# Patient Record
Sex: Male | Born: 1935 | Race: White | Hispanic: No | Marital: Single | Smoking: Never smoker
Health system: Southern US, Community
[De-identification: ages and names within clinical notes are randomized; demographics above are authoritative.]

## PROBLEM LIST (undated history)

## (undated) DIAGNOSIS — K222 Esophageal obstruction: Secondary | ICD-10-CM

## (undated) DIAGNOSIS — C61 Malignant neoplasm of prostate: Secondary | ICD-10-CM

## (undated) DIAGNOSIS — M5126 Other intervertebral disc displacement, lumbar region: Secondary | ICD-10-CM

## (undated) DIAGNOSIS — K635 Polyp of colon: Secondary | ICD-10-CM

## (undated) DIAGNOSIS — M419 Scoliosis, unspecified: Secondary | ICD-10-CM

## (undated) DIAGNOSIS — M1990 Unspecified osteoarthritis, unspecified site: Secondary | ICD-10-CM

## (undated) DIAGNOSIS — M4726 Other spondylosis with radiculopathy, lumbar region: Secondary | ICD-10-CM

## (undated) DIAGNOSIS — K219 Gastro-esophageal reflux disease without esophagitis: Secondary | ICD-10-CM

## (undated) DIAGNOSIS — M5416 Radiculopathy, lumbar region: Secondary | ICD-10-CM

## (undated) DIAGNOSIS — Z85820 Personal history of malignant melanoma of skin: Secondary | ICD-10-CM

## (undated) DIAGNOSIS — K5190 Ulcerative colitis, unspecified, without complications: Secondary | ICD-10-CM

## (undated) DIAGNOSIS — L570 Actinic keratosis: Secondary | ICD-10-CM

## (undated) DIAGNOSIS — M48061 Spinal stenosis, lumbar region without neurogenic claudication: Secondary | ICD-10-CM

## (undated) DIAGNOSIS — Z85828 Personal history of other malignant neoplasm of skin: Secondary | ICD-10-CM

## (undated) DIAGNOSIS — I10 Essential (primary) hypertension: Secondary | ICD-10-CM

## (undated) HISTORY — PX: COLONOSCOPY: SHX174

## (undated) HISTORY — PX: HERNIA REPAIR: SHX51

## (undated) HISTORY — DX: Actinic keratosis: L57.0

## (undated) HISTORY — PX: OTHER SURGICAL HISTORY: SHX169

## (undated) HISTORY — PX: TONSILLECTOMY: SUR1361

---

## 2004-02-05 ENCOUNTER — Ambulatory Visit: Payer: Self-pay | Admitting: Gastroenterology

## 2004-02-05 HISTORY — PX: ESOPHAGOGASTRODUODENOSCOPY: SHX1529

## 2004-03-20 HISTORY — PX: JOINT REPLACEMENT: SHX530

## 2004-04-07 ENCOUNTER — Ambulatory Visit: Payer: Self-pay | Admitting: Gastroenterology

## 2004-05-25 ENCOUNTER — Other Ambulatory Visit: Payer: Self-pay

## 2004-05-31 ENCOUNTER — Ambulatory Visit: Payer: Self-pay | Admitting: Orthopaedic Surgery

## 2004-09-16 ENCOUNTER — Ambulatory Visit: Payer: Self-pay | Admitting: Orthopaedic Surgery

## 2007-10-23 ENCOUNTER — Ambulatory Visit: Payer: Self-pay | Admitting: Unknown Physician Specialty

## 2008-09-07 DIAGNOSIS — C439 Malignant melanoma of skin, unspecified: Secondary | ICD-10-CM

## 2008-09-07 HISTORY — DX: Malignant melanoma of skin, unspecified: C43.9

## 2008-11-10 DIAGNOSIS — C4492 Squamous cell carcinoma of skin, unspecified: Secondary | ICD-10-CM

## 2008-11-10 HISTORY — DX: Squamous cell carcinoma of skin, unspecified: C44.92

## 2008-12-25 ENCOUNTER — Ambulatory Visit: Payer: Self-pay | Admitting: Unknown Physician Specialty

## 2009-12-18 HISTORY — PX: OTHER SURGICAL HISTORY: SHX169

## 2010-01-20 ENCOUNTER — Ambulatory Visit: Payer: Self-pay | Admitting: Unknown Physician Specialty

## 2010-01-21 LAB — PATHOLOGY REPORT

## 2011-05-10 ENCOUNTER — Other Ambulatory Visit: Payer: Self-pay | Admitting: Gastroenterology

## 2012-11-25 DIAGNOSIS — L818 Other specified disorders of pigmentation: Secondary | ICD-10-CM

## 2012-11-25 HISTORY — DX: Other specified disorders of pigmentation: L81.8

## 2014-06-08 DIAGNOSIS — M4726 Other spondylosis with radiculopathy, lumbar region: Secondary | ICD-10-CM | POA: Insufficient documentation

## 2014-06-08 DIAGNOSIS — M419 Scoliosis, unspecified: Secondary | ICD-10-CM

## 2014-06-08 DIAGNOSIS — M48061 Spinal stenosis, lumbar region without neurogenic claudication: Secondary | ICD-10-CM

## 2014-06-08 DIAGNOSIS — M4150 Other secondary scoliosis, site unspecified: Secondary | ICD-10-CM | POA: Insufficient documentation

## 2014-06-08 HISTORY — DX: Other spondylosis with radiculopathy, lumbar region: M47.26

## 2014-06-08 HISTORY — DX: Scoliosis, unspecified: M41.9

## 2014-06-08 HISTORY — DX: Spinal stenosis, lumbar region without neurogenic claudication: M48.061

## 2014-06-23 DIAGNOSIS — M5416 Radiculopathy, lumbar region: Secondary | ICD-10-CM

## 2014-06-23 HISTORY — DX: Radiculopathy, lumbar region: M54.16

## 2015-04-01 ENCOUNTER — Encounter: Payer: Self-pay | Admitting: *Deleted

## 2015-04-02 ENCOUNTER — Ambulatory Visit: Payer: Medicare Other | Admitting: Certified Registered Nurse Anesthetist

## 2015-04-02 ENCOUNTER — Encounter
Admission: RE | Disposition: A | Discharge status: Home / Self Care | Payer: Self-pay | Source: Ambulatory Visit | Attending: Unknown Physician Specialty

## 2015-04-02 ENCOUNTER — Encounter: Payer: Self-pay | Admitting: *Deleted

## 2015-04-02 ENCOUNTER — Ambulatory Visit
Admission: RE | Admit: 2015-04-02 | Discharge: 2015-04-02 | Disposition: A | Discharge status: Home / Self Care | Payer: Medicare Other | Source: Ambulatory Visit | Attending: Unknown Physician Specialty | Admitting: Unknown Physician Specialty

## 2015-04-02 DIAGNOSIS — Z85820 Personal history of malignant melanoma of skin: Secondary | ICD-10-CM | POA: Diagnosis not present

## 2015-04-02 DIAGNOSIS — M1990 Unspecified osteoarthritis, unspecified site: Secondary | ICD-10-CM | POA: Insufficient documentation

## 2015-04-02 DIAGNOSIS — D72822 Plasmacytosis: Secondary | ICD-10-CM | POA: Diagnosis not present

## 2015-04-02 DIAGNOSIS — K219 Gastro-esophageal reflux disease without esophagitis: Secondary | ICD-10-CM | POA: Diagnosis not present

## 2015-04-02 DIAGNOSIS — Z8546 Personal history of malignant neoplasm of prostate: Secondary | ICD-10-CM | POA: Diagnosis not present

## 2015-04-02 DIAGNOSIS — Z85828 Personal history of other malignant neoplasm of skin: Secondary | ICD-10-CM | POA: Insufficient documentation

## 2015-04-02 DIAGNOSIS — M5126 Other intervertebral disc displacement, lumbar region: Secondary | ICD-10-CM | POA: Insufficient documentation

## 2015-04-02 DIAGNOSIS — M4806 Spinal stenosis, lumbar region: Secondary | ICD-10-CM | POA: Insufficient documentation

## 2015-04-02 DIAGNOSIS — Z79899 Other long term (current) drug therapy: Secondary | ICD-10-CM | POA: Insufficient documentation

## 2015-04-02 DIAGNOSIS — K5190 Ulcerative colitis, unspecified, without complications: Secondary | ICD-10-CM | POA: Insufficient documentation

## 2015-04-02 DIAGNOSIS — Z96612 Presence of left artificial shoulder joint: Secondary | ICD-10-CM | POA: Insufficient documentation

## 2015-04-02 DIAGNOSIS — K5730 Diverticulosis of large intestine without perforation or abscess without bleeding: Secondary | ICD-10-CM | POA: Insufficient documentation

## 2015-04-02 DIAGNOSIS — K640 First degree hemorrhoids: Secondary | ICD-10-CM | POA: Insufficient documentation

## 2015-04-02 DIAGNOSIS — Z86010 Personal history of colonic polyps: Secondary | ICD-10-CM | POA: Diagnosis not present

## 2015-04-02 DIAGNOSIS — M419 Scoliosis, unspecified: Secondary | ICD-10-CM | POA: Diagnosis not present

## 2015-04-02 HISTORY — DX: Other spondylosis with radiculopathy, lumbar region: M47.26

## 2015-04-02 HISTORY — DX: Personal history of other malignant neoplasm of skin: Z85.828

## 2015-04-02 HISTORY — DX: Polyp of colon: K63.5

## 2015-04-02 HISTORY — DX: Ulcerative colitis, unspecified, without complications: K51.90

## 2015-04-02 HISTORY — DX: Gastro-esophageal reflux disease without esophagitis: K21.9

## 2015-04-02 HISTORY — DX: Spinal stenosis, lumbar region without neurogenic claudication: M48.061

## 2015-04-02 HISTORY — DX: Other intervertebral disc displacement, lumbar region: M51.26

## 2015-04-02 HISTORY — DX: Esophageal obstruction: K22.2

## 2015-04-02 HISTORY — DX: Malignant neoplasm of prostate: C61

## 2015-04-02 HISTORY — DX: Radiculopathy, lumbar region: M54.16

## 2015-04-02 HISTORY — DX: Personal history of malignant melanoma of skin: Z85.820

## 2015-04-02 HISTORY — DX: Scoliosis, unspecified: M41.9

## 2015-04-02 HISTORY — DX: Unspecified osteoarthritis, unspecified site: M19.90

## 2015-04-02 HISTORY — PX: COLONOSCOPY WITH PROPOFOL: SHX5780

## 2015-04-02 SURGERY — COLONOSCOPY WITH PROPOFOL
Anesthesia: General

## 2015-04-02 MED ORDER — PROPOFOL 10 MG/ML IV BOLUS
INTRAVENOUS | Status: DC | PRN
  Administered 2015-04-02: 50 mg via INTRAVENOUS

## 2015-04-02 MED ORDER — GLYCOPYRROLATE 0.2 MG/ML IJ SOLN
INTRAMUSCULAR | Status: DC | PRN
  Administered 2015-04-02: 0.2 mg via INTRAVENOUS

## 2015-04-02 MED ORDER — SODIUM CHLORIDE 0.9 % IV SOLN
INTRAVENOUS | Status: DC
  Administered 2015-04-02: 09:00:00 via INTRAVENOUS

## 2015-04-02 MED ORDER — SODIUM CHLORIDE 0.9 % IV SOLN: INTRAVENOUS | Status: DC

## 2015-04-02 MED ORDER — PROPOFOL 500 MG/50ML IV EMUL
INTRAVENOUS | Status: DC | PRN
  Administered 2015-04-02: 140 ug/kg/min via INTRAVENOUS

## 2015-04-02 MED ORDER — LIDOCAINE HCL (CARDIAC) 20 MG/ML IV SOLN
INTRAVENOUS | Status: DC | PRN
  Administered 2015-04-02: 40 mg via INTRAVENOUS

## 2015-04-02 NOTE — Anesthesia Preprocedure Evaluation (Signed)
Anesthesia Evaluation  Patient identified by MRN, date of birth, ID band Patient awake    Reviewed: Allergy & Precautions, H&P , NPO status , Patient's Chart, lab work & pertinent test results, reviewed documented beta blocker date and time   Airway Mallampati: II  TM Distance: >3 FB Neck ROM: full    Dental no notable dental hx.    Pulmonary neg pulmonary ROS,    Pulmonary exam normal breath sounds clear to auscultation       Cardiovascular Exercise Tolerance: Good negative cardio ROS   Rhythm:regular Rate:Normal     Neuro/Psych negative neurological ROS  negative psych ROS   GI/Hepatic negative GI ROS, Neg liver ROS, PUD, GERD  ,  Endo/Other  negative endocrine ROS  Renal/GU negative Renal ROS  negative genitourinary   Musculoskeletal  (+) Arthritis ,   Abdominal   Peds  Hematology negative hematology ROS (+)   Anesthesia Other Findings   Reproductive/Obstetrics negative OB ROS                             Anesthesia Physical Anesthesia Plan  ASA: II  Anesthesia Plan: General   Post-op Pain Management:    Induction:   Airway Management Planned:   Additional Equipment:   Intra-op Plan:   Post-operative Plan:   Informed Consent: I have reviewed the patients History and Physical, chart, labs and discussed the procedure including the risks, benefits and alternatives for the proposed anesthesia with the patient or authorized representative who has indicated his/her understanding and acceptance.   Dental Advisory Given  Plan Discussed with: CRNA  Anesthesia Plan Comments:         Anesthesia Quick Evaluation

## 2015-04-02 NOTE — Anesthesia Procedure Notes (Signed)
Performed by: Bobbye Reinitz Pre-anesthesia Checklist: Patient identified, Emergency Drugs available, Suction available, Patient being monitored and Timeout performed Patient Re-evaluated:Patient Re-evaluated prior to inductionOxygen Delivery Method: Nasal cannula Intubation Type: IV induction       

## 2015-04-02 NOTE — H&P (Signed)
Primary Care Physician:  Maryland Pink, MD Primary Gastroenterologist:  Dr. Vira Agar  Pre-Procedure History & Physical: HPI:  CARLESTER DAYS is a 82 y.o. male is here for an colonoscopy.   Past Medical History  Diagnosis Date  . Lumbar radiculitis 06/23/14  . Scoliosis 06/08/14    Degenerative scoliosis in adult patient; chronic  . Spinal stenosis of lumbar region at multiple levels 06/08/14    Chronic  . Osteoarthritis of spine with radiculopathy, lumbar region 06/08/14    chronic  . Arthritis   . History of melanoma     chest and leg  . History of basal cell cancer     left temple  . Lumbar disc herniation   . Colon polyps   . GERD (gastroesophageal reflux disease)   . Esophageal stricture   . Ulcerative colitis ([place])     left sided  . Prostate cancer ([place])     Positive for SEED implantation    Past Surgical History  Procedure Laterality Date  . Seed implant for treatment of prostate cancer      Performed in [place]  . Hernia repair    . Joint replacement Left 2006    Shoulder replacement  . Tonsillectomy    . Pnuemovax  12/2009  . Esophagogastroduodenoscopy  02/05/2004  . Colonoscopy  03/2004; 10/2007; 12/2008; 01/2010    PH of adenomatous polyps; FH colon polyps (sister)    Prior to Admission medications   Medication Sig Start Date End Date Taking? Authorizing Provider  celecoxib (CELEBREX) 200 MG capsule Take 200 mg by mouth 2 (two) times daily.   Yes Historical Provider, MD  diphenhydramine-acetaminophen (TYLENOL PM) 25-500 MG TABS tablet Take 2 tablets by mouth at bedtime as needed.   Yes Historical Provider, MD  docusate sodium (COLACE) 100 MG capsule Take 100 mg by mouth 2 (two) times daily.   Yes Historical Provider, MD  Mesalamine (ASACOL HD) 800 MG TBEC Take by mouth.   Yes Historical Provider, MD  Multiple Vitamins-Minerals (MULTIVITAMIN ADULT) TABS Take by mouth.   Yes Historical Provider, MD  Omega-3 Fatty Acids (FISH OIL CONCENTRATE) 1000 MG CAPS Take  1 capsule by mouth daily.   Yes Historical Provider, MD  omeprazole (PRILOSEC) 20 MG capsule Take 20 mg by mouth daily.   Yes Historical Provider, MD  sildenafil (REVATIO) 20 MG tablet Take 20-100 mg by mouth as needed.   Yes Historical Provider, MD  valsartan (DIOVAN) 320 MG tablet Take 320 mg by mouth daily.   Yes Historical Provider, MD  Zinc 50 MG TABS Take 50 mg by mouth daily.   Yes Historical Provider, MD    Allergies as of 03/02/2015  . (Not on File)    History reviewed. No pertinent family history.  Social History   Social History  . Marital Status: Single    Spouse Name: N/A  . Number of Children: N/A  . Years of Education: N/A   Occupational History  . Not on file.   Social History Main Topics  . Smoking status: Never Smoker   . Smokeless tobacco: Not on file  . Alcohol Use: 0.0 oz/week    0 Glasses of wine per week  . Drug Use: No  . Sexual Activity: Not on file   Other Topics Concern  . Not on file   Social History Narrative    Review of Systems: See HPI, otherwise negative ROS  Physical Exam: BP 154/116 mmHg  Pulse 81  Temp(Src) 97.8 F (36.6  C) (Tympanic)  Resp 12  Ht 5\' 9"  (1.753 m)  Wt 72.576 kg (160 lb)  BMI 23.62 kg/m2  SpO2 100% General:   Alert,  pleasant and cooperative in NAD Head:  Normocephalic and atraumatic. Neck:  Supple; no masses or thyromegaly. Lungs:  Clear throughout to auscultation.    Heart:  Regular rate and rhythm. Abdomen:  Soft, nontender and nondistended. Normal bowel sounds, without guarding, and without rebound.   Neurologic:  Alert and  oriented x4;  grossly normal neurologically.  Impression/Plan: [name] is here for an colonoscopy to be performed for [facility] colon polyps  Risks, benefits, limitations, and alternatives regarding  colonoscopy have been reviewed with the patient.  Questions have been answered.  All parties agreeable.   Gaylyn Cheers, MD  04/02/2015, 9:15 AM

## 2015-04-02 NOTE — Transfer of Care (Signed)
Immediate Anesthesia Transfer of Care Note  Patient: Grant Miranda  Procedure(s) Performed: Procedure(s): COLONOSCOPY WITH PROPOFOL (N/A)  Patient Location: PACU  Anesthesia Type:General  Level of Consciousness: sedated  Airway & Oxygen Therapy: Patient Spontanous Breathing and Patient connected to nasal cannula oxygen  Post-op Assessment: Report given to RN and Post -op Vital signs reviewed and stable  Post vital signs: Reviewed and stable  Last Vitals:  Filed Vitals:   04/02/15 0826  BP: 154/116  Pulse: 81  Temp: 36.6 C  Resp: 12    Complications: No apparent anesthesia complications

## 2015-04-02 NOTE — Op Note (Signed)
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 Kary Kos, MD (Referring MD) Medicines:         Propofol per Anesthesia Complications:     No immediate complications. Procedure:         Pre-Anesthesia Assessment:                    - After reviewing the risks and benefits, the patient was                     deemed in satisfactory condition to undergo the procedure.                    After obtaining informed consent, the colonoscope was                     passed under direct vision. Throughout the procedure, the                     patient's blood pressure, pulse, and oxygen saturations                     were monitored continuously. The Colonoscope was                     introduced through the anus and advanced to the the cecum,                     identified by appendiceal orifice and ileocecal valve. The                     colonoscopy was performed without difficulty. The patient                     tolerated the procedure well. The quality of the bowel                     preparation was excellent. Findings:      A localized area of granular mucosa was found in the mid sigmoid colon.       Biopsies were taken with a cold forceps for histology.      Multiple small and large-mouthed diverticula were found in the sigmoid       colon, in the descending colon, in the transverse colon and in the       ascending colon.      Internal hemorrhoids were found during endoscopy. The hemorrhoids were       medium-sized and Grade I (internal hemorrhoids that do not  prolapse). Impression:        - Granularity in the mid sigmoid colon. Biopsied.                    - Diverticulosis in the sigmoid colon, in the descending  colon, in the transverse colon and in the ascending colon.                    - Internal hemorrhoids. Recommendation:    - Await pathology results. Manya Silvas, MD 04/02/2015 9:46:55 AM This report has been signed electronically. Number of Addenda: 0 Note Initiated On: 04/02/2015 8:52 AM Scope Withdrawal Time: 0 hours 10 minutes 11 seconds  Total Procedure Duration: 0 hours 21 minutes 49 seconds       [facility]

## 2015-04-03 NOTE — Progress Notes (Signed)
Voicemail Reached.  No Message Left.

## 2015-04-04 NOTE — Anesthesia Postprocedure Evaluation (Signed)
Anesthesia Post Note  Patient: Grant Miranda  Procedure(s) Performed: Procedure(s) (LRB): COLONOSCOPY WITH PROPOFOL (N/A)  Patient location during evaluation: PACU Anesthesia Type: General Level of consciousness: awake and alert Pain management: pain level controlled Vital Signs Assessment: post-procedure vital signs reviewed and stable Respiratory status: spontaneous breathing, nonlabored ventilation, respiratory function stable and patient connected to nasal cannula oxygen Cardiovascular status: blood pressure returned to baseline and stable Postop Assessment: no signs of nausea or vomiting Anesthetic complications: no    Last Vitals:  Filed Vitals:   04/02/15 0826 04/02/15 0945  BP: 154/116   Pulse: 81   Temp: 36.6 C 36.1 C  Resp: 12     Last Pain: There were no vitals filed for this visit.               Molli Barrows

## 2015-04-05 LAB — SURGICAL PATHOLOGY

## 2015-04-07 ENCOUNTER — Encounter: Payer: Self-pay | Admitting: Unknown Physician Specialty

## 2018-01-16 DIAGNOSIS — Z85828 Personal history of other malignant neoplasm of skin: Secondary | ICD-10-CM

## 2018-01-16 HISTORY — DX: Personal history of other malignant neoplasm of skin: Z85.828

## 2018-08-22 DIAGNOSIS — Z860101 Personal history of adenomatous and serrated colon polyps: Secondary | ICD-10-CM | POA: Insufficient documentation

## 2018-08-22 DIAGNOSIS — Z86010 Personal history of colonic polyps: Secondary | ICD-10-CM | POA: Insufficient documentation

## 2018-09-21 DIAGNOSIS — D720 Genetic anomalies of leukocytes: Secondary | ICD-10-CM | POA: Insufficient documentation

## 2018-09-21 NOTE — Progress Notes (Signed)
[place]  [phone] Fax:(336) [phone]  ID: Grant Miranda OB: [birth year]  MR#: [id number]  TDD#:[id number]  Patient Care Team: Maryland Pink, MD as PCP - General (Family Medicine)  CHIEF COMPLAINT:  Increased immature granulocytes.  INTERVAL HISTORY: Patient is an 85 year old male who was noted to have a persistently elevated white blood cell count along with an increase in immature granulocytes.  He currently feels well and is asymptomatic.  He denies any recent fevers or illnesses.  He has a good appetite and denies weight loss.  He has no chest pain, shortness of breath, cough, or hemoptysis.  He denies any nausea, vomiting, constipation, or diarrhea.  He has no urinary complaints.  Patient feels at his baseline offers no specific complaints today.  REVIEW OF SYSTEMS:   Review of Systems  Constitutional: Negative.  Negative for fever, malaise/fatigue and weight loss.  Respiratory: Negative.  Negative for cough, hemoptysis and shortness of breath.   Cardiovascular: Negative.  Negative for chest pain and leg swelling.  Gastrointestinal: Negative.  Negative for abdominal pain.  Genitourinary: Negative.  Negative for dysuria.  Musculoskeletal: Negative.  Negative for back pain.  Skin: Negative.  Negative for rash.  Neurological: Negative.  Negative for dizziness, focal weakness, weakness and headaches.  Psychiatric/Behavioral: Negative.  The patient is not nervous/anxious.     As per HPI. Otherwise, a complete review of systems is negative.  PAST MEDICAL HISTORY: Past Medical History:  Diagnosis Date  . Arthritis   . Colon polyps   . Esophageal stricture   . GERD (gastroesophageal reflux disease)   . History of basal cell cancer    left temple  . History of melanoma    chest and leg  . Lumbar disc herniation   . Lumbar radiculitis 06/23/14  . Osteoarthritis of spine with radiculopathy, lumbar region 06/08/14   chronic  . Prostate cancer  ([place])    Positive for SEED implantation  . Scoliosis 06/08/14   Degenerative scoliosis in adult patient; chronic  . Spinal stenosis of lumbar region at multiple levels 06/08/14   Chronic  . Ulcerative colitis ([place])    left sided    PAST SURGICAL HISTORY: Past Surgical History:  Procedure Laterality Date  . COLONOSCOPY  03/2004; 10/2007; 12/2008; 01/2010   PH of adenomatous polyps; FH colon polyps (sister)  . COLONOSCOPY WITH PROPOFOL N/A 04/02/2015   Procedure: COLONOSCOPY WITH PROPOFOL;  Surgeon: Manya Silvas, MD;  Location: [facility] ENDOSCOPY;  Service: Endoscopy;  Laterality: N/A;  . ESOPHAGOGASTRODUODENOSCOPY  02/05/2004  . HERNIA REPAIR    . JOINT REPLACEMENT Left 2006   Shoulder replacement  . Pnuemovax  12/2009  . Seed implant for treatment of prostate cancer     Performed in [place]  . TONSILLECTOMY      FAMILY HISTORY: Family History  Problem Relation Age of Onset  . Breast cancer Sister     ADVANCED DIRECTIVES (Y/N):  N  HEALTH MAINTENANCE: Social History   Tobacco Use  . Smoking status: Never Smoker  . Smokeless tobacco: Never Used  Substance Use Topics  . Alcohol use: Yes    Alcohol/week: 0.0 standard drinks  . Drug use: No     Colonoscopy:  PAP:  Bone density:  Lipid panel:  Allergies  Allergen Reactions  . Hydrochlorothiazide Rash  . Norvasc [Amlodipine] Rash  . Penicillin V Potassium Rash  . Sulfa Antibiotics Rash    Current Outpatient Medications  Medication Sig Dispense Refill  . celecoxib (CELEBREX) 200  MG capsule Take 200 mg by mouth 2 (two) times daily.    . diphenhydramine-acetaminophen (TYLENOL PM) 25-500 MG TABS tablet Take 2 tablets by mouth at bedtime as needed.    . docusate sodium (COLACE) 100 MG capsule Take 100 mg by mouth 2 (two) times daily.    . Mesalamine (ASACOL HD) 800 MG TBEC Take by mouth.    . Multiple Vitamins-Minerals (MULTIVITAMIN ADULT) TABS Take by mouth.    . Omega-3 Fatty Acids (FISH OIL CONCENTRATE) 1000 MG  CAPS Take 1 capsule by mouth daily.    Marland Kitchen omeprazole (PRILOSEC) 20 MG capsule Take 20 mg by mouth daily.    . valsartan (DIOVAN) 320 MG tablet Take 320 mg by mouth daily.    . sildenafil (REVATIO) 20 MG tablet Take 20-100 mg by mouth as needed.     No current facility-administered medications for this visit.     OBJECTIVE: Vitals:   09/27/18 1023  BP: (!) 150/90  Pulse: 83  Resp: 18  Temp: (!) 96.4 F (35.8 C)     Body mass index is 24.29 kg/m.    ECOG FS:0 - Asymptomatic  General: Well-developed, well-nourished, no acute distress. Eyes: Pink conjunctiva, anicteric sclera. HEENT: Normocephalic, moist mucous membranes, clear oropharnyx. Lungs: Clear to auscultation bilaterally. Heart: Regular rate and rhythm. No rubs, murmurs, or gallops. Abdomen: Soft, nontender, nondistended. No organomegaly noted, normoactive bowel sounds. Musculoskeletal: No edema, cyanosis, or clubbing. Neuro: Alert, answering all questions appropriately. Cranial nerves grossly intact. Skin: No rashes or petechiae noted. Psych: Normal affect. Lymphatics: No cervical, calvicular, axillary or inguinal LAD.   LAB RESULTS:  No results found for: NA, K, CL, CO2, GLUCOSE, BUN, CREATININE, CALCIUM, PROT, ALBUMIN, AST, ALT, ALKPHOS, BILITOT, GFRNONAA, GFRAA  Lab Results  Component Value Date   WBC 7.1 09/27/2018   NEUTROABS 5.1 09/27/2018   HGB 14.9 09/27/2018   HCT 43.9 09/27/2018   MCV 97.8 09/27/2018   PLT 180 09/27/2018     STUDIES: No results found.  ASSESSMENT: Increased immature granulocytes.  PLAN:    1.  Increased immature granulocytes: Patient's white blood cell count is now within normal limits, although he continues to have a mild increase in immature granulocytes.  Unclear the clinical significance.  Have ordered peripheral blood flow cytometry for further evaluation.  Patient will have a virtual video visit in 3 weeks to discuss the results and any additional follow-up necessary.  I  spent a total of 45 minutes face-to-face with the patient of which greater than 50% of the visit was spent in counseling and coordination of care as detailed above.  Patient expressed understanding and was in agreement with this plan. He also understands that He can call clinic at any time with any questions, concerns, or complaints.    Lloyd Huger, MD   09/29/2018 9:06 AM

## 2018-09-27 ENCOUNTER — Other Ambulatory Visit: Payer: Self-pay

## 2018-09-27 ENCOUNTER — Encounter (INDEPENDENT_AMBULATORY_CARE_PROVIDER_SITE_OTHER): Payer: Self-pay

## 2018-09-27 ENCOUNTER — Encounter: Payer: Self-pay | Admitting: Oncology

## 2018-09-27 ENCOUNTER — Inpatient Hospital Stay: Payer: Medicare Other

## 2018-09-27 ENCOUNTER — Inpatient Hospital Stay: Payer: Medicare Other | Attending: Oncology | Admitting: Oncology

## 2018-09-27 DIAGNOSIS — D720 Genetic anomalies of leukocytes: Secondary | ICD-10-CM

## 2018-09-27 DIAGNOSIS — Z85828 Personal history of other malignant neoplasm of skin: Secondary | ICD-10-CM | POA: Insufficient documentation

## 2018-09-27 DIAGNOSIS — K5190 Ulcerative colitis, unspecified, without complications: Secondary | ICD-10-CM | POA: Insufficient documentation

## 2018-09-27 DIAGNOSIS — K219 Gastro-esophageal reflux disease without esophagitis: Secondary | ICD-10-CM | POA: Insufficient documentation

## 2018-09-27 DIAGNOSIS — M5126 Other intervertebral disc displacement, lumbar region: Secondary | ICD-10-CM | POA: Insufficient documentation

## 2018-09-27 DIAGNOSIS — C61 Malignant neoplasm of prostate: Secondary | ICD-10-CM | POA: Insufficient documentation

## 2018-09-27 DIAGNOSIS — Z85820 Personal history of malignant melanoma of skin: Secondary | ICD-10-CM | POA: Insufficient documentation

## 2018-09-27 DIAGNOSIS — K635 Polyp of colon: Secondary | ICD-10-CM | POA: Insufficient documentation

## 2018-09-27 DIAGNOSIS — D72828 Other elevated white blood cell count: Secondary | ICD-10-CM

## 2018-09-27 DIAGNOSIS — Z803 Family history of malignant neoplasm of breast: Secondary | ICD-10-CM

## 2018-09-27 LAB — CBC WITH DIFFERENTIAL/PLATELET
Abs Immature Granulocytes: 0.12 10*3/uL — ABNORMAL HIGH (ref 0.00–0.07)
Basophils Absolute: 0 10*3/uL (ref 0.0–0.1)
Basophils Relative: 0 %
Eosinophils Absolute: 0.2 10*3/uL (ref 0.0–0.5)
Eosinophils Relative: 3 %
HCT: 43.9 % (ref 39.0–52.0)
Hemoglobin: 14.9 g/dL (ref 13.0–17.0)
Immature Granulocytes: 2 %
Lymphocytes Relative: 16 %
Lymphs Abs: 1.1 10*3/uL (ref 0.7–4.0)
MCH: 33.2 pg (ref 26.0–34.0)
MCHC: 33.9 g/dL (ref 30.0–36.0)
MCV: 97.8 fL (ref 80.0–100.0)
Monocytes Absolute: 0.5 10*3/uL (ref 0.1–1.0)
Monocytes Relative: 7 %
Neutro Abs: 5.1 10*3/uL (ref 1.7–7.7)
Neutrophils Relative %: 72 %
Platelets: 180 10*3/uL (ref 150–400)
RBC: 4.49 MIL/uL (ref 4.22–5.81)
RDW: 12 % (ref 11.5–15.5)
WBC: 7.1 10*3/uL (ref 4.0–10.5)
nRBC: 0 % (ref 0.0–0.2)

## 2018-09-27 NOTE — Progress Notes (Signed)
New patient hematology visit.

## 2018-09-30 LAB — COMP PANEL: LEUKEMIA/LYMPHOMA: Immunophenotypic Profile: 0

## 2018-10-18 ENCOUNTER — Ambulatory Visit: Payer: Medicare Other | Admitting: Oncology

## 2018-10-20 NOTE — Progress Notes (Signed)
[place]  Telephone:(336) [phone] Fax:(336) [phone]  ID: [name] OB: 1932/05/12  MR#: [id number]  DGL#:[id number]  Patient Care Team: Maryland Pink, MD as PCP - General (Family Medicine)  I connected with [name] on 10/23/18 at 10:00 AM EDT by video enabled telemedicine visit and verified that I am speaking with the correct person using two identifiers.   I discussed the limitations, risks, security and privacy concerns of performing an evaluation and management service by telemedicine and the availability of in-person appointments. I also discussed with the patient that there may be a patient responsible charge related to this service. The patient expressed understanding and agreed to proceed.   Other persons participating in the visit and their role in the encounter: Patient, MD  Patient's location: Home Provider's location: Clinic  CHIEF COMPLAINT: Possible CLL.  INTERVAL HISTORY: Patient initially agreed to video enabled telemedicine visit to discuss his laboratory work, but this was converted to telephone only.  He continues to feel well and remains asymptomatic.  He denies any recent fevers or illnesses.  He has a good appetite and denies weight loss.  He has no chest pain, shortness of breath, cough, or hemoptysis.  He denies any nausea, vomiting, constipation, or diarrhea.  He has no urinary complaints.  Patient offers no specific complaints today.  REVIEW OF SYSTEMS:   Review of Systems  Constitutional: Negative.  Negative for fever, malaise/fatigue and weight loss.  Respiratory: Negative.  Negative for cough, hemoptysis and shortness of breath.   Cardiovascular: Negative.  Negative for chest pain and leg swelling.  Gastrointestinal: Negative.  Negative for abdominal pain.  Genitourinary: Negative.  Negative for dysuria.  Musculoskeletal: Negative.  Negative for back pain.  Skin: Negative.  Negative for rash.  Neurological: Negative.   Negative for dizziness, focal weakness, weakness and headaches.  Psychiatric/Behavioral: Negative.  The patient is not nervous/anxious.     As per HPI. Otherwise, a complete review of systems is negative.  PAST MEDICAL HISTORY: Past Medical History:  Diagnosis Date  . Arthritis   . Colon polyps   . Esophageal stricture   . GERD (gastroesophageal reflux disease)   . History of basal cell cancer    left temple  . History of melanoma    chest and leg  . Lumbar disc herniation   . Lumbar radiculitis 06/23/14  . Osteoarthritis of spine with radiculopathy, lumbar region 06/08/14   chronic  . Prostate cancer ([place])    Positive for SEED implantation  . Scoliosis 06/08/14   Degenerative scoliosis in adult patient; chronic  . Spinal stenosis of lumbar region at multiple levels 06/08/14   Chronic  . Ulcerative colitis ([place])    left sided    PAST SURGICAL HISTORY: Past Surgical History:  Procedure Laterality Date  . COLONOSCOPY  03/2004; 10/2007; 12/2008; 01/2010   PH of adenomatous polyps; FH colon polyps (sister)  . COLONOSCOPY WITH PROPOFOL N/A 04/02/2015   Procedure: COLONOSCOPY WITH PROPOFOL;  Surgeon: Manya Silvas, MD;  Location: [facility] ENDOSCOPY;  Service: Endoscopy;  Laterality: N/A;  . ESOPHAGOGASTRODUODENOSCOPY  02/05/2004  . HERNIA REPAIR    . JOINT REPLACEMENT Left 2006   Shoulder replacement  . Pnuemovax  12/2009  . Seed implant for treatment of prostate cancer     Performed in [place]  . TONSILLECTOMY      FAMILY HISTORY: Family History  Problem Relation Age of Onset  . Breast cancer Sister     ADVANCED DIRECTIVES (Y/N):  N  HEALTH MAINTENANCE: Social History   Tobacco Use  . Smoking status: Never Smoker  . Smokeless tobacco: Never Used  Substance Use Topics  . Alcohol use: Yes    Alcohol/week: 0.0 standard drinks  . Drug use: No     Colonoscopy:  PAP:  Bone density:  Lipid panel:  Allergies  Allergen Reactions  . Hydrochlorothiazide Rash  .  Norvasc [Amlodipine] Rash  . Penicillin V Potassium Rash  . Sulfa Antibiotics Rash    Current Outpatient Medications  Medication Sig Dispense Refill  . celecoxib (CELEBREX) 200 MG capsule Take 200 mg by mouth daily.     . diphenhydramine-acetaminophen (TYLENOL PM) 25-500 MG TABS tablet Take 2 tablets by mouth at bedtime as needed.    . docusate sodium (COLACE) 100 MG capsule Take 100 mg by mouth 2 (two) times daily.    . Mesalamine (ASACOL HD) 800 MG TBEC Take by mouth.    . Multiple Vitamins-Minerals (MULTIVITAMIN ADULT) TABS Take by mouth.    . Omega-3 Fatty Acids (FISH OIL CONCENTRATE) 1000 MG CAPS Take 1 capsule by mouth daily.    Marland Kitchen omeprazole (PRILOSEC) 20 MG capsule Take 20 mg by mouth daily.    . sildenafil (REVATIO) 20 MG tablet Take 20-100 mg by mouth as needed.    . valsartan (DIOVAN) 320 MG tablet Take 320 mg by mouth daily.     No current facility-administered medications for this visit.     OBJECTIVE: There were no vitals filed for this visit.   There is no height or weight on file to calculate BMI.    ECOG FS:0 - Asymptomatic   LAB RESULTS:  No results found for: NA, K, CL, CO2, GLUCOSE, BUN, CREATININE, CALCIUM, PROT, ALBUMIN, AST, ALT, ALKPHOS, BILITOT, GFRNONAA, GFRAA  Lab Results  Component Value Date   WBC 7.1 09/27/2018   NEUTROABS 5.1 09/27/2018   HGB 14.9 09/27/2018   HCT 43.9 09/27/2018   MCV 97.8 09/27/2018   PLT 180 09/27/2018     STUDIES: No results found.  ASSESSMENT: Possible CLL.  PLAN:    1.  Possible CLL: Although patient's white blood cell count and lymphocyte count is within normal limits.  Peripheral blood flow cytometry detected a small population, less than 1%, of clonal cells consistent with CLL.  All of his other laboratory work is either negative or within normal limits.  No intervention is needed at this time.  Patient does not require imaging or bone marrow biopsy.  Return to clinic in 1 year with repeat laboratory work and  further evaluation.    I provided 15 minutes of face-to-face video visit time during this encounter, and > 50% was spent counseling as documented under my assessment & plan.   Patient expressed understanding and was in agreement with this plan. He also understands that He can call clinic at any time with any questions, concerns, or complaints.    Lloyd Huger, MD   10/23/2018 7:16 AM

## 2018-10-21 ENCOUNTER — Other Ambulatory Visit: Payer: Self-pay

## 2018-10-22 ENCOUNTER — Encounter: Payer: Self-pay | Admitting: Oncology

## 2018-10-22 ENCOUNTER — Other Ambulatory Visit: Payer: Self-pay

## 2018-10-22 ENCOUNTER — Inpatient Hospital Stay: Payer: Medicare Other | Attending: Oncology | Admitting: Oncology

## 2018-10-22 DIAGNOSIS — D720 Genetic anomalies of leukocytes: Secondary | ICD-10-CM | POA: Diagnosis not present

## 2018-10-22 DIAGNOSIS — C9110 Chronic lymphocytic leukemia of B-cell type not having achieved remission: Secondary | ICD-10-CM | POA: Diagnosis not present

## 2018-10-22 NOTE — Progress Notes (Signed)
Patient denies any concerns today.  

## 2019-01-07 ENCOUNTER — Other Ambulatory Visit: Payer: Self-pay | Admitting: Orthopedic Surgery

## 2019-01-07 ENCOUNTER — Ambulatory Visit: Payer: Medicare Other

## 2019-01-07 ENCOUNTER — Other Ambulatory Visit (HOSPITAL_COMMUNITY): Payer: Self-pay | Admitting: Orthopedic Surgery

## 2019-01-07 DIAGNOSIS — S72092A Other fracture of head and neck of left femur, initial encounter for closed fracture: Secondary | ICD-10-CM

## 2019-01-08 ENCOUNTER — Ambulatory Visit
Admission: RE | Admit: 2019-01-08 | Discharge: 2019-01-08 | Disposition: A | Discharge status: Home / Self Care | Payer: Medicare Other | Source: Ambulatory Visit | Attending: Orthopedic Surgery | Admitting: Orthopedic Surgery

## 2019-01-08 ENCOUNTER — Other Ambulatory Visit: Payer: Self-pay

## 2019-01-08 DIAGNOSIS — S72092A Other fracture of head and neck of left femur, initial encounter for closed fracture: Secondary | ICD-10-CM | POA: Insufficient documentation

## 2019-04-01 ENCOUNTER — Other Ambulatory Visit (HOSPITAL_COMMUNITY): Payer: Self-pay | Admitting: Physical Medicine and Rehabilitation

## 2019-04-01 ENCOUNTER — Other Ambulatory Visit: Payer: Self-pay | Admitting: Physical Medicine and Rehabilitation

## 2019-04-01 DIAGNOSIS — M5416 Radiculopathy, lumbar region: Secondary | ICD-10-CM

## 2019-04-11 ENCOUNTER — Ambulatory Visit: Admission: RE | Admit: 2019-04-11 | Payer: Medicare PPO | Source: Ambulatory Visit

## 2019-04-20 ENCOUNTER — Ambulatory Visit: Payer: Medicare PPO

## 2019-05-02 ENCOUNTER — Ambulatory Visit
Admission: RE | Admit: 2019-05-02 | Discharge: 2019-05-02 | Disposition: A | Discharge status: Home / Self Care | Payer: Medicare PPO | Source: Ambulatory Visit | Attending: Physical Medicine and Rehabilitation | Admitting: Physical Medicine and Rehabilitation

## 2019-05-02 ENCOUNTER — Other Ambulatory Visit: Payer: Self-pay

## 2019-05-02 DIAGNOSIS — M5416 Radiculopathy, lumbar region: Secondary | ICD-10-CM | POA: Insufficient documentation

## 2019-05-15 ENCOUNTER — Ambulatory Visit: Payer: Medicare PPO | Attending: Internal Medicine

## 2019-05-15 DIAGNOSIS — Z23 Encounter for immunization: Secondary | ICD-10-CM | POA: Insufficient documentation

## 2019-05-15 NOTE — Progress Notes (Signed)
   Covid-19 Vaccination Clinic  Name:  Grant Miranda    MRN: [id number] DOB: 25-Jun-1932  05/15/2019  Mr. Grant Miranda was observed post Covid-19 immunization for 15 minutes without incidence. He was provided with Vaccine Information Sheet and instruction to access the V-Safe system.   Mr. Grant Miranda was instructed to call 911 with any severe reactions post vaccine: Marland Kitchen Difficulty breathing  . Swelling of your face and throat  . A fast heartbeat  . A bad rash all over your body  . Dizziness and weakness    Immunizations Administered    Name Date Dose VIS Date Route   Pfizer COVID-19 Vaccine 05/15/2019 12:39 PM 0.3 mL 02/28/2019 Intramuscular   Manufacturer: [place]   Lot: [id number]   [place]: [id number]

## 2019-06-11 ENCOUNTER — Ambulatory Visit: Payer: Medicare PPO | Attending: Internal Medicine

## 2019-06-11 DIAGNOSIS — Z23 Encounter for immunization: Secondary | ICD-10-CM

## 2019-06-11 NOTE — Progress Notes (Signed)
   Covid-19 Vaccination Clinic  Name:  Grant Miranda    MRN: [id number] DOB: 1932/06/16  06/11/2019  [name] was observed post Covid-19 immunization for 15 minutes without incident. He was provided with Vaccine Information Sheet and instruction to access the V-Safe system.   [name] was instructed to call 911 with any severe reactions post vaccine: Marland Kitchen Difficulty breathing  . Swelling of face and throat  . A fast heartbeat  . A bad rash all over body  . Dizziness and weakness   Immunizations Administered    Name Date Dose VIS Date Route   Pfizer COVID-19 Vaccine 06/11/2019 11:11 AM 0.3 mL 02/28/2019 Intramuscular   Manufacturer: [employer]   Lot: [id number]   [place]: [id number]

## 2019-06-25 ENCOUNTER — Other Ambulatory Visit: Payer: Self-pay

## 2019-06-25 ENCOUNTER — Ambulatory Visit: Payer: Medicare PPO | Admitting: Dermatology

## 2019-06-25 DIAGNOSIS — L578 Other skin changes due to chronic exposure to nonionizing radiation: Secondary | ICD-10-CM

## 2019-06-25 DIAGNOSIS — L603 Nail dystrophy: Secondary | ICD-10-CM

## 2019-06-25 DIAGNOSIS — L821 Other seborrheic keratosis: Secondary | ICD-10-CM

## 2019-06-25 DIAGNOSIS — L570 Actinic keratosis: Secondary | ICD-10-CM

## 2019-06-25 DIAGNOSIS — L219 Seborrheic dermatitis, unspecified: Secondary | ICD-10-CM

## 2019-06-25 DIAGNOSIS — C44329 Squamous cell carcinoma of skin of other parts of face: Secondary | ICD-10-CM

## 2019-06-25 DIAGNOSIS — D492 Neoplasm of unspecified behavior of bone, soft tissue, and skin: Secondary | ICD-10-CM

## 2019-06-25 MED ORDER — KETOCONAZOLE 2 % EX SHAM: 1.0000 "application " | MEDICATED_SHAMPOO | CUTANEOUS | 4 refills | Status: DC

## 2019-06-25 NOTE — Patient Instructions (Signed)

## 2019-06-25 NOTE — Progress Notes (Signed)
Follow-Up Visit   Subjective  Grant Miranda is a 86 y.o. male who presents for the following: Actinic Keratosis (face, ears f/u), Seborrheic Keratosis (inflamed f/u trunk), and check toenails (all nails on R foot and L great toenail, hx of wearing work boots to work in yard.).   The following portions of the chart were reviewed this encounter and updated as appropriate:     Review of Systems: No other skin or systemic complaints.  Objective  Well appearing patient in no apparent distress; mood and affect are within normal limits.  A focused examination was performed including trunk, arms, scalp, face, ears, toenails. Relevant physical exam findings are noted in the Assessment and Plan.  Objective  face, scalp, ears x 16 (16): Pink scaly macules   Objective  Left Hallux Toe Nail Plate, Right 2nd Toe Nail Plate, Right 3rd Toe Nail Plate, Right 4th Toe Nail Plate, Right 5th Toe Nail Plate, Right Hallux Toe Nail Plate: Severe nail dystrophy with onycholysis especially of both great toenails with L great toenail with subungual hematoma  Objective  L ant lat forehead at hairline: 2.0cm crusted pearly pap  Objective  Scalp: Pink patches with greasy scale.   Assessment & Plan   Severe AK (actinic keratosis) (16) face, scalp, ears x 16  Destruction of lesion - face, scalp, ears x 16 Complexity: simple   Destruction method: cryotherapy   Informed consent: discussed and consent obtained   Timeout:  patient name, date of birth, surgical site, and procedure verified Lesion destroyed using liquid nitrogen: Yes   Region frozen until ice ball extended beyond lesion: Yes   Outcome: patient tolerated procedure well with no complications   Post-procedure details: wound care instructions given    Nail dystrophy (6) Left Hallux Toe Nail Plate; Right Hallux Toe Nail Plate; Right 2nd Toe Nail Plate; Right 3rd Toe Nail Plate; Right 4th Toe Nail Plate; Right 5th Toe Nail Plate  With  Onycholysis and Subungual hematoma likely from work boots and working in yard.  Discussed may take 55m to grow out unless retraumatized  Neoplasm of skin L ant lat forehead at hairline  Skin / nail biopsy Type of biopsy: tangential   Informed consent: discussed and consent obtained   Timeout: patient name, date of birth, surgical site, and procedure verified   Procedure prep:  Patient was prepped and draped in usual sterile fashion Prep type:  Isopropyl alcohol Anesthesia: the lesion was anesthetized in a standard fashion   Anesthetic:  1% lidocaine w/ epinephrine 1-100,000 buffered w/ 8.4% NaHCO3 Instrument used: flexible razor blade   Outcome: patient tolerated procedure well   Post-procedure details: sterile dressing applied and wound care instructions given   Dressing type: bandage and petrolatum    Specimen 1 - Surgical pathology Differential Diagnosis: D48.5 R/O BCC 2.0cm crusted pearly pap   Seborrheic dermatitis Scalp  Start Ketoconazole 2% shampoo 3d/wk, let sit several minutes and rinse out  ketoconazole (NIZORAL) 2 % shampoo - Scalp  Actinic Damage - diffuse scaly erythematous macules with underlying dyspigmentation - Recommend daily broad spectrum sunscreen SPF 30+ to sun-exposed areas, reapply every 2 hours as needed.  - Call for new or changing lesions. Seborrheic Keratoses - Stuck-on, waxy, tan-brown papules and plaques  - Discussed benign etiology and prognosis. - Observe - Call for any changes   Return in about 3 months (around 09/24/2019) for AK, UBSE, bx f/u.   I, Othelia Pulling, RMA, am acting as scribe for Sarina Ser, MD .

## 2019-06-26 ENCOUNTER — Encounter: Payer: Self-pay | Admitting: Dermatology

## 2019-07-07 ENCOUNTER — Other Ambulatory Visit: Payer: Self-pay

## 2019-07-07 ENCOUNTER — Telehealth: Payer: Self-pay

## 2019-07-07 DIAGNOSIS — C44329 Squamous cell carcinoma of skin of other parts of face: Secondary | ICD-10-CM

## 2019-07-07 NOTE — Telephone Encounter (Signed)
Discussed biopsy results with pt, will schedule for Mohs

## 2019-07-07 NOTE — Progress Notes (Signed)
Patient being referred for [facility] to Dr. Lacinda Axon at [facility].

## 2019-07-19 ENCOUNTER — Emergency Department
Admission: EM | Admit: 2019-07-19 | Discharge: 2019-07-19 | Disposition: A | Discharge status: Home / Self Care | Payer: Medicare PPO | Attending: Emergency Medicine | Admitting: Emergency Medicine

## 2019-07-19 ENCOUNTER — Other Ambulatory Visit: Payer: Self-pay

## 2019-07-19 ENCOUNTER — Emergency Department: Payer: Medicare PPO

## 2019-07-19 DIAGNOSIS — Y999 Unspecified external cause status: Secondary | ICD-10-CM | POA: Insufficient documentation

## 2019-07-19 DIAGNOSIS — Z79899 Other long term (current) drug therapy: Secondary | ICD-10-CM | POA: Insufficient documentation

## 2019-07-19 DIAGNOSIS — Y9389 Activity, other specified: Secondary | ICD-10-CM | POA: Insufficient documentation

## 2019-07-19 DIAGNOSIS — Z23 Encounter for immunization: Secondary | ICD-10-CM | POA: Diagnosis not present

## 2019-07-19 DIAGNOSIS — Z8546 Personal history of malignant neoplasm of prostate: Secondary | ICD-10-CM | POA: Insufficient documentation

## 2019-07-19 DIAGNOSIS — Z96612 Presence of left artificial shoulder joint: Secondary | ICD-10-CM | POA: Insufficient documentation

## 2019-07-19 DIAGNOSIS — S0101XA Laceration without foreign body of scalp, initial encounter: Secondary | ICD-10-CM | POA: Diagnosis not present

## 2019-07-19 DIAGNOSIS — W108XXA Fall (on) (from) other stairs and steps, initial encounter: Secondary | ICD-10-CM | POA: Diagnosis not present

## 2019-07-19 DIAGNOSIS — Y92009 Unspecified place in unspecified non-institutional (private) residence as the place of occurrence of the external cause: Secondary | ICD-10-CM | POA: Diagnosis not present

## 2019-07-19 DIAGNOSIS — Z85820 Personal history of malignant melanoma of skin: Secondary | ICD-10-CM | POA: Diagnosis not present

## 2019-07-19 DIAGNOSIS — S51812A Laceration without foreign body of left forearm, initial encounter: Secondary | ICD-10-CM | POA: Insufficient documentation

## 2019-07-19 DIAGNOSIS — W19XXXA Unspecified fall, initial encounter: Secondary | ICD-10-CM

## 2019-07-19 DIAGNOSIS — S0990XA Unspecified injury of head, initial encounter: Secondary | ICD-10-CM | POA: Diagnosis present

## 2019-07-19 MED ORDER — LIDOCAINE HCL (PF) 1 % IJ SOLN
5.0000 mL | Freq: Once | INTRAMUSCULAR | Status: AC
  Administered 2019-07-19: 5 mL
  Filled 2019-07-19: qty 5

## 2019-07-19 MED ORDER — TETANUS-DIPHTH-ACELL PERTUSSIS 5-2.5-18.5 LF-MCG/0.5 IM SUSP
0.5000 mL | Freq: Once | INTRAMUSCULAR | Status: AC
  Administered 2019-07-19: 0.5 mL via INTRAMUSCULAR
  Filled 2019-07-19: qty 0.5

## 2019-07-19 MED ORDER — BACITRACIN-NEOMYCIN-POLYMYXIN 400-5-5000 EX OINT
TOPICAL_OINTMENT | Freq: Once | CUTANEOUS | Status: AC
  Administered 2019-07-19: 1 via TOPICAL
  Filled 2019-07-19: qty 1

## 2019-07-19 MED ORDER — CEPHALEXIN 250 MG PO CAPS: 250.0000 mg | ORAL_CAPSULE | Freq: Three times a day (TID) | ORAL | 0 refills | Status: AC

## 2019-07-19 NOTE — ED Triage Notes (Signed)
Pt arrives to ER after tripping and hitting head on metal. Denies hitting floor. Denies LOC. Denies blood thinner use. A&O, ambulatory.

## 2019-07-19 NOTE — ED Provider Notes (Signed)
[facility] Emergency Department Provider Note ____________________________________________  Time seen: [id number]  I have reviewed the triage vital signs and the nursing notes.  HISTORY  Chief Complaint  Head Injury   HPI Grant Miranda is a 86 y.o. male presents to the ER with complaint of head laceration status post fall.  He reports approximately 2 hours ago he was trying to go up some steps into his sunroom when he fell backwards hitting his head on a metal plant stand.  He was able to control the bleeding.  He denies any headaches or visual changes at this time.  He does have some lightheadedness but reports that he has had this since his last Covid vaccine.  He denies any neck or back pain.  He did not take any medication PTA.  Past Medical History:  Diagnosis Date  . Actinic keratosis   . Arthritis   . Atypical melanocytic hyperplasia 11/25/2012   AMP. Right paraspinal lower back. Excised: 01/08/2013  . Colon polyps   . Esophageal stricture   . GERD (gastroesophageal reflux disease)   . History of basal cell cancer    left temple  . History of melanoma    chest and leg  . Hx of basal cell carcinoma 01/16/2018   Left ear on conchal side of the mid anti helix. Nodular pattern  . Lumbar disc herniation   . Lumbar radiculitis 06/23/14  . Melanoma ([place]) 09/07/2008   Right lower medial knee. MIS, LM type, margins free of tumor. Excision: 09/28/2008  . Osteoarthritis of spine with radiculopathy, lumbar region 06/08/14   chronic  . Prostate cancer ([place])    Positive for SEED implantation  . Scoliosis 06/08/14   Degenerative scoliosis in adult patient; chronic  . Spinal stenosis of lumbar region at multiple levels 06/08/14   Chronic  . Squamous cell carcinoma of skin 11/10/2008   Left medial lower leg above ankle. Well differentiated. Excised: 12/02/2018, margins free  . Squamous cell carcinoma of skin 05/24/2009   Right upper med. knee. Well differentiated.  .  Squamous cell carcinoma of skin 12/28/2009   Left vertex scalp. SCCis arising in AK  . Squamous cell carcinoma of skin 09/11/2012   Right medial mid thigh. KA pattern  . Squamous cell carcinoma of skin 09/11/2012   Left knee. Well differentiated.  . Squamous cell carcinoma of skin 12/10/2014   Left medial proximal pretibial below knee. KA pattern  . Squamous cell carcinoma of skin 12/01/2015   Right anterior crown. [place] with SCCis. Tx: EDC  . Squamous cell carcinoma of skin 07/16/2017   Left upper eyelid. SCCis arising in AK  . Ulcerative colitis ([place])    left sided    Patient Active Problem List   Diagnosis Date Noted  . Colon polyps 09/27/2018  . GERD (gastroesophageal reflux disease) 09/27/2018  . History of basal cell cancer 09/27/2018  . History of melanoma 09/27/2018  . Lumbar disc herniation 09/27/2018  . Prostate cancer ([place]) 09/27/2018  . Ulcerative colitis ([place]) 09/27/2018  . 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 Hx of adenomatous colonic polyps 08/22/2018  . Lumbar radiculitis 06/23/2014  . Osteoarthritis of spine with radiculopathy, lumbar region 06/08/2014  . Degenerative scoliosis in adult patient 06/08/2014  . Spinal stenosis of lumbar region at multiple levels 06/08/2014    Past Surgical History:  Procedure Laterality Date  . COLONOSCOPY  03/2004; 10/2007; 12/2008; 01/2010   PH of adenomatous polyps; FH colon polyps (sister)  . COLONOSCOPY WITH PROPOFOL  N/A 04/02/2015   Procedure: COLONOSCOPY WITH PROPOFOL;  Surgeon: Manya Silvas, MD;  Location: [facility] ENDOSCOPY;  Service: Endoscopy;  Laterality: N/A;  . ESOPHAGOGASTRODUODENOSCOPY  02/05/2004  . HERNIA REPAIR    . JOINT REPLACEMENT Left 2006   Shoulder replacement  . Pnuemovax  12/2009  . Seed implant for treatment of prostate cancer     Performed in [place]  . TONSILLECTOMY      Prior to Admission medications   Medication Sig Start Date End Date Taking? Authorizing Provider  celecoxib (CELEBREX)  200 MG capsule Take 200 mg by mouth daily.     [provider]  cephALEXin (KEFLEX) 250 MG capsule Take 1 capsule (250 mg total) by mouth 3 (three) times daily for 10 days. 07/19/19 07/29/19  Jearld Fenton, NP  diphenhydramine-acetaminophen (TYLENOL PM) 25-500 MG TABS tablet Take 2 tablets by mouth at bedtime as needed.    [provider]  docusate sodium (COLACE) 100 MG capsule Take 100 mg by mouth 2 (two) times daily.    [provider]  ketoconazole (NIZORAL) 2 % shampoo Apply 1 application topically 3 (three) times a week. Wash scalp 3 times a week, let sit 5 minutes before rinsing out 06/25/19   Ralene Bathe, MD  Mesalamine (ASACOL HD) 800 MG TBEC Take by mouth.    [provider]  Multiple Vitamins-Minerals (MULTIVITAMIN ADULT) TABS Take by mouth.    [provider]  Omega-3 Fatty Acids (FISH OIL CONCENTRATE) 1000 MG CAPS Take 1 capsule by mouth daily.    [provider]  omeprazole (PRILOSEC) 20 MG capsule Take 20 mg by mouth daily.    [provider]  sildenafil (REVATIO) 20 MG tablet Take 20-100 mg by mouth as needed.    [provider]  valsartan (DIOVAN) 320 MG tablet Take 320 mg by mouth daily.    [provider]    Allergies Hydrochlorothiazide, Norvasc [amlodipine], Penicillin v potassium, and Sulfa antibiotics  Family History  Problem Relation Age of Onset  . Breast cancer Sister     Social History Social History   Tobacco Use  . Smoking status: Never Smoker  . Smokeless tobacco: Never Used  Substance Use Topics  . Alcohol use: Yes    Alcohol/week: 0.0 standard drinks  . Drug use: No    Review of Systems  Constitutional: Negative for fever, chills or body aches. Eyes: Negative for visual changes. Cardiovascular: Negative for chest pain or chest tightness. Respiratory: Negative for cough or shortness of breath. Musculoskeletal: Negative for neck or back pain. Skin: Negative for  laceration of scalp. Neurological: Positive for lightheadedness.  Negative for headaches, dizziness, focal weakness, tingling or numbness. ____________________________________________  PHYSICAL EXAM:  VITAL SIGNS: ED Triage Vitals  Enc Vitals Group     BP 07/19/19 1430 (!) 210/106     Pulse Rate 07/19/19 1430 82     Resp 07/19/19 1430 18     Temp 07/19/19 1430 (!) 97.4 F (36.3 C)     Temp Source 07/19/19 1430 Oral     SpO2 07/19/19 1430 95 %     Weight 07/19/19 1432 160 lb (72.6 kg)     Height 07/19/19 1432 5\' 9"  (1.753 m)     Head Circumference --      Peak Flow --      Pain Score 07/19/19 1431 5     Pain Loc --      Pain Edu? --      Excl.  in [place]? --     Constitutional: Alert and oriented. Well appearing and in no distress. Head: Normocephalic. Eyes: Conjunctivae are normal. PERRL. Normal extraocular movements Cardiovascular: Normal rate, regular rhythm.  Respiratory: Normal respiratory effort. No wheezes/rales/rhonchi. Musculoskeletal: Limited flexion, extension and rotation of the cervical spine due to arthritis, not due to acute pain.  No bony tenderness over the cervical or thoracic spine.  He has limited external range of motion of bilateral shoulders secondary to arthritis but not pain.  Normal normal internal range of motion of bilateral shoulders.  Strength 5/5 BUE.  Handgrips equal. Neurologic:  Normal gait without ataxia.  Stutters with normal speech.. No gross focal neurologic deficits are appreciated. Skin: 4 cm linear laceration noted to crown of head. 4 cm crescent shaped skin tear noted of left forearm. ____________________________________________   RADIOLOGY  Imaging Orders     CT Head Wo Contrast     CT Cervical Spine Wo Contrast IMPRESSION:  1. No acute intracranial abnormality.  2. Atrophy and chronic microvascular ischemic changes of the brain.  3. No evidence for acute fracture or subluxation of the cervical  spine.  4. Mild-to-moderate multilevel  degenerative changes of the cervical  spine.    ____________________________________________  PROCEDURES  .Marland KitchenLaceration Repair  Date/Time: 07/19/2019 4:09 PM Performed by: Jearld Fenton, NP Authorized by: Jearld Fenton, NP   Consent:    Consent obtained:  Verbal   Consent given by:  Patient   Risks discussed:  Infection, pain and poor cosmetic result Anesthesia (see MAR for exact dosages):    Anesthesia method:  Local infiltration   Local anesthetic:  Lidocaine 1% w/o epi Laceration details:    Location:  Scalp   Length (cm):  4 Repair type:    Repair type:  Simple Pre-procedure details:    Preparation:  Patient was prepped and draped in usual sterile fashion and imaging obtained to evaluate for foreign bodies Exploration:    Hemostasis achieved with:  Direct pressure   Wound exploration: entire depth of wound probed and visualized     Contaminated: no   Treatment:    Area cleansed with:  Betadine and saline   Amount of cleaning:  Standard   Irrigation solution:  Sterile saline   Irrigation method:  Syringe Skin repair:    Repair method:  Staples   Number of staples:  6 Approximation:    Approximation:  Close Post-procedure details:    Dressing:  Antibiotic ointment   Patient tolerance of procedure:  Tolerated well, no immediate complications    ____________________________________________  INITIAL IMPRESSION / ASSESSMENT AND PLAN / ED COURSE Scalp Laceration, Skin Tear of Left Forearm s/p Fall:  CT head and neck, no acute findings Skin tear cleansed, applied Benzoin and steri strips Laceration repaired with staples- see procedure note RX for Keflex 250 mg TID x 7 days Follow up with PCP for staple removal Tdap today    I reviewed the patient's prescription history over the last 12 months in the multi-state controlled substances database(s) that includes [place], [place], [place], [place], [place], [place], [place], [place], [place],  [place], [place], [place], [place], and [place].  Results were notable for no recent controlled substances. ____________________________________________  FINAL CLINICAL IMPRESSION(S) / ED DIAGNOSES  Final diagnoses:  Laceration of scalp, initial encounter  Skin tear of left forearm without complication, initial encounter  Fall, initial encounter      Jearld Fenton, NP 07/19/19 1617    Duffy Bruce, MD 07/19/19  2033  

## 2019-07-19 NOTE — ED Notes (Signed)
Pt with laceration to back of head. Bleeding controlled at this time and pt is A&O x4. Pt also has skin tear to left forearm. Both wounds cleaned and dressed after EDP assessed and treated.

## 2019-07-19 NOTE — Discharge Instructions (Addendum)
You were seen today for scalp laceration and skin tear to left forearm status post a fall.  Your CTs did not show any acute findings.  We gave you a tetanus booster today.  You may shower as normal just avoid rubbing directly over the staples over the skin tear.  Monitor for headache, dizziness, redness, swelling or purulent discharge.  Follow-up with your PCP in 1 week to have your staples removed.

## 2019-10-01 ENCOUNTER — Other Ambulatory Visit: Payer: Self-pay

## 2019-10-01 ENCOUNTER — Ambulatory Visit: Payer: Medicare PPO | Admitting: Dermatology

## 2019-10-01 DIAGNOSIS — L219 Seborrheic dermatitis, unspecified: Secondary | ICD-10-CM | POA: Diagnosis not present

## 2019-10-01 DIAGNOSIS — L821 Other seborrheic keratosis: Secondary | ICD-10-CM

## 2019-10-01 DIAGNOSIS — T1490XA Injury, unspecified, initial encounter: Secondary | ICD-10-CM

## 2019-10-01 DIAGNOSIS — D229 Melanocytic nevi, unspecified: Secondary | ICD-10-CM

## 2019-10-01 DIAGNOSIS — L570 Actinic keratosis: Secondary | ICD-10-CM | POA: Diagnosis not present

## 2019-10-01 DIAGNOSIS — Z1283 Encounter for screening for malignant neoplasm of skin: Secondary | ICD-10-CM | POA: Diagnosis not present

## 2019-10-01 DIAGNOSIS — L820 Inflamed seborrheic keratosis: Secondary | ICD-10-CM

## 2019-10-01 DIAGNOSIS — L814 Other melanin hyperpigmentation: Secondary | ICD-10-CM

## 2019-10-01 DIAGNOSIS — L578 Other skin changes due to chronic exposure to nonionizing radiation: Secondary | ICD-10-CM

## 2019-10-01 DIAGNOSIS — S81811A Laceration without foreign body, right lower leg, initial encounter: Secondary | ICD-10-CM

## 2019-10-01 DIAGNOSIS — Z85828 Personal history of other malignant neoplasm of skin: Secondary | ICD-10-CM

## 2019-10-01 DIAGNOSIS — D1800 Hemangioma unspecified site: Secondary | ICD-10-CM

## 2019-10-01 MED ORDER — MUPIROCIN 2 % EX OINT: TOPICAL_OINTMENT | CUTANEOUS | 3 refills | Status: DC

## 2019-10-01 NOTE — Progress Notes (Addendum)
Follow-Up Visit   Subjective  Grant Miranda is a 86 y.o. male who presents for the following: UBSE (recheck SCC of the L ant lat forehead at hairline MOHs site ), recheck AK's (face, scalp, and ears - check for any new or persistent AK's), and check wound (patient fell about 5 weeks ago and tore the skin off his leg. He was prescribed Doxycycline Hyclate 100mg  and Mupirocin 2% ointment which he has since finished. He would like the wound checked today to make sure he doesn't need more antibiotics). The patient presents for Upper Body Skin Exam (UBSE) for skin cancer screening and mole check.  The following portions of the chart were reviewed this encounter and updated as appropriate:  Tobacco  Allergies  Meds  Problems  Med Hx  Surg Hx  Fam Hx     Review of Systems:  No other skin or systemic complaints except as noted in HPI or Assessment and Plan.  Objective  Well appearing patient in no apparent distress; mood and affect are within normal limits.  All skin waist up examined. The legs were examined.  Objective  Scalp: Erythematous keratotic or waxy stuck-on papule or plaque.   Objective  Scalp, face, ears x 9, trunk x 14, arms x 3 (26): Erythematous thin papules/macules with gritty scale.   Objective  Scalp: Pink patches with greasy scale.   Objective  Right Lower Leg - Anterior: Healing, crusted, ulceration   Assessment & Plan    Inflamed seborrheic keratosis Scalp  Destruction of lesion - Scalp Complexity: simple   Destruction method: cryotherapy   Informed consent: discussed and consent obtained   Timeout:  patient name, date of birth, surgical site, and procedure verified Lesion destroyed using liquid nitrogen: Yes   Region frozen until ice ball extended beyond lesion: Yes   Outcome: patient tolerated procedure well with no complications   Post-procedure details: wound care instructions given    AK (actinic keratosis) (26) Scalp, face, ears x 9, trunk  x 14, arms x 3  Destruction of lesion - Scalp, face, ears x 9, trunk x 14, arms x 3 Complexity: simple   Destruction method: cryotherapy   Informed consent: discussed and consent obtained   Timeout:  patient name, date of birth, surgical site, and procedure verified Lesion destroyed using liquid nitrogen: Yes   Region frozen until ice ball extended beyond lesion: Yes   Outcome: patient tolerated procedure well with no complications   Post-procedure details: wound care instructions given    Seborrheic dermatitis Scalp  Improved - Continue Ketoconazole 2% shampoo 2-3 days per week  Other Related Medications ketoconazole (NIZORAL) 2 % shampoo  Laceration of right lower extremity, initial encounter Right Lower Leg - Anterior  Trauma occurred June 7th. Debridement performed today - Discussed wound care. Area was covered with Mupirocin 2% ointment, a non-stick gauze, and wrapped with Coban.  Continue Mupirocin 2% ointment daily. 22g 3RF.  mupirocin ointment (BACTROBAN) 2 % - Right Lower Leg - Anterior  Skin cancer screening  Traumatic injury   Lentigines - Scattered tan macules - Discussed due to sun exposure - Benign, observe - Call for any changes  Seborrheic Keratoses - Stuck-on, waxy, tan-brown papules and plaques  - Discussed benign etiology and prognosis. - Observe - Call for any changes  Melanocytic Nevi - Tan-brown and/or pink-flesh-colored symmetric macules and papules - Benign appearing on exam today - Observation - Call clinic for new or changing moles - Recommend daily use of broad spectrum  spf 30+ sunscreen to sun-exposed areas.   Hemangiomas - Red papules - Discussed benign nature - Observe - Call for any changes  Actinic Damage - diffuse scaly erythematous macules with underlying dyspigmentation - Recommend daily broad spectrum sunscreen SPF 30+ to sun-exposed areas, reapply every 2 hours as needed.  - Call for new or changing lesions.  History  of Squamous Cell Carcinoma of the Skin - No evidence of recurrence today - No lymphadenopathy - Recommend regular full body skin exams - Recommend daily broad spectrum sunscreen SPF 30+ to sun-exposed areas, reapply every 2 hours as needed.  - Call if any new or changing lesions are noted between office visits  Skin cancer screening performed today.  Return in about 3 months (around 01/01/2020).  Luther Redo, CMA, am acting as scribe for Sarina Ser, MD .  Documentation: I have reviewed the above documentation for accuracy and completeness, and I agree with the above.  Sarina Ser, MD

## 2019-10-05 ENCOUNTER — Encounter: Payer: Self-pay | Admitting: Dermatology

## 2019-10-24 ENCOUNTER — Ambulatory Visit: Payer: Medicare Other | Admitting: Oncology

## 2019-10-24 ENCOUNTER — Inpatient Hospital Stay: Payer: Medicare PPO | Attending: Oncology

## 2019-10-24 DIAGNOSIS — C9110 Chronic lymphocytic leukemia of B-cell type not having achieved remission: Secondary | ICD-10-CM | POA: Diagnosis present

## 2019-10-24 LAB — CBC WITH DIFFERENTIAL/PLATELET
Abs Immature Granulocytes: 0.11 10*3/uL — ABNORMAL HIGH (ref 0.00–0.07)
Basophils Absolute: 0 10*3/uL (ref 0.0–0.1)
Basophils Relative: 1 %
Eosinophils Absolute: 0.2 10*3/uL (ref 0.0–0.5)
Eosinophils Relative: 3 %
HCT: 40.4 % (ref 39.0–52.0)
Hemoglobin: 14 g/dL (ref 13.0–17.0)
Immature Granulocytes: 2 %
Lymphocytes Relative: 22 %
Lymphs Abs: 1.5 10*3/uL (ref 0.7–4.0)
MCH: 34.1 pg — ABNORMAL HIGH (ref 26.0–34.0)
MCHC: 34.7 g/dL (ref 30.0–36.0)
MCV: 98.3 fL (ref 80.0–100.0)
Monocytes Absolute: 0.6 10*3/uL (ref 0.1–1.0)
Monocytes Relative: 8 %
Neutro Abs: 4.3 10*3/uL (ref 1.7–7.7)
Neutrophils Relative %: 64 %
Platelets: 174 10*3/uL (ref 150–400)
RBC: 4.11 MIL/uL — ABNORMAL LOW (ref 4.22–5.81)
RDW: 12.1 % (ref 11.5–15.5)
WBC: 6.7 10*3/uL (ref 4.0–10.5)
nRBC: 0 % (ref 0.0–0.2)

## 2019-10-24 NOTE — Progress Notes (Signed)
[place]  [phone] [phone]  ID: [name] OB: June 20, 1932  MR#: [id number]  PRF#:[id number]  Patient Care Team: [place] Pink, MD as PCP - General (Family Medicine)   CHIEF COMPLAINT: CLL.  INTERVAL HISTORY: Patient returns to clinic today for further evaluation and discussion of his laboratory results.  He continues to feel well and remains asymptomatic.  He has no neurologic complaints.  He denies any recent fevers or illnesses.  He has a good appetite and denies weight loss.  He has no chest pain, shortness of breath, cough, or hemoptysis.  He denies any nausea, vomiting, constipation, or diarrhea.  He has no urinary complaints.  Patient feels at his baseline offers no specific complaints today.  REVIEW OF SYSTEMS:   Review of Systems  Constitutional: Negative.  Negative for fever, malaise/fatigue and weight loss.  Respiratory: Negative.  Negative for cough, hemoptysis and shortness of breath.   Cardiovascular: Negative.  Negative for chest pain and leg swelling.  Gastrointestinal: Negative.  Negative for abdominal pain.  Genitourinary: Negative.  Negative for dysuria.  Musculoskeletal: Negative.  Negative for back pain.  Skin: Negative.  Negative for rash.  Neurological: Negative.  Negative for dizziness, focal weakness, weakness and headaches.  Psychiatric/Behavioral: Negative.  The patient is not nervous/anxious.     As per HPI. Otherwise, a complete review of systems is negative.  PAST MEDICAL HISTORY: Past Medical History:  Diagnosis Date   Actinic keratosis    Arthritis    Atypical melanocytic hyperplasia 11/25/2012   AMP. Right paraspinal lower back. Excised: 01/08/2013   Colon polyps    Esophageal stricture    GERD (gastroesophageal reflux disease)    History of basal cell cancer    left temple   History of melanoma    chest and leg   Hx of basal cell carcinoma 01/16/2018   Left ear on conchal side of  the mid anti helix. Nodular pattern   Lumbar disc herniation    Lumbar radiculitis 06/23/14   Melanoma ([place]) 09/07/2008   Right lower medial knee. MIS, LM type, margins free of tumor. Excision: 09/28/2008   Osteoarthritis of spine with radiculopathy, lumbar region 06/08/14   chronic   Prostate cancer ([place])    Positive for SEED implantation   Scoliosis 06/08/14   Degenerative scoliosis in adult patient; chronic   Spinal stenosis of lumbar region at multiple levels 06/08/14   Chronic   Squamous cell carcinoma of skin 11/10/2008   Left medial lower leg above ankle. Well differentiated. Excised: 12/02/2018, margins free   Squamous cell carcinoma of skin 05/24/2009   Right upper med. knee. Well differentiated.   Squamous cell carcinoma of skin 12/28/2009   Left vertex scalp. SCCis arising in AK   Squamous cell carcinoma of skin 09/11/2012   Right medial mid thigh. KA pattern   Squamous cell carcinoma of skin 09/11/2012   Left knee. Well differentiated.   Squamous cell carcinoma of skin 12/10/2014   Left medial proximal pretibial below knee. KA pattern   Squamous cell carcinoma of skin 12/01/2015   Right anterior crown. [place] with SCCis. Tx: EDC   Squamous cell carcinoma of skin 07/16/2017   Left upper eyelid. SCCis arising in AK   Ulcerative colitis ([place])    left sided    PAST SURGICAL HISTORY: Past Surgical History:  Procedure Laterality Date   COLONOSCOPY  03/2004; 10/2007; 12/2008; 01/2010   PH of adenomatous polyps; FH colon polyps (sister)   COLONOSCOPY WITH PROPOFOL  N/A 04/02/2015   Procedure: COLONOSCOPY WITH PROPOFOL;  Surgeon: Scot Jun, MD;  Location: [facility] ENDOSCOPY;  Service: Endoscopy;  Laterality: N/A;   ESOPHAGOGASTRODUODENOSCOPY  02/05/2004   HERNIA REPAIR     JOINT REPLACEMENT Left 2006   Shoulder replacement   Pnuemovax  12/2009   Seed implant for treatment of prostate cancer     Performed in [place]   TONSILLECTOMY      FAMILY  HISTORY: Family History  Problem Relation Age of Onset   Breast cancer Sister     ADVANCED DIRECTIVES (Y/N):  N  HEALTH MAINTENANCE: Social History   Tobacco Use   Smoking status: Never Smoker   Smokeless tobacco: Never Used  Substance Use Topics   Alcohol use: Yes    Alcohol/week: 0.0 standard drinks   Drug use: No     Colonoscopy:  PAP:  Bone density:  Lipid panel:  Allergies  Allergen Reactions   Hydrochlorothiazide Rash   Norvasc [Amlodipine] Rash   Penicillin V Potassium Rash   Sulfa Antibiotics Rash    Current Outpatient Medications  Medication Sig Dispense Refill   celecoxib (CELEBREX) 200 MG capsule Take 200 mg by mouth daily.      diphenhydramine-acetaminophen (TYLENOL PM) 25-500 MG TABS tablet Take 2 tablets by mouth at bedtime as needed.     docusate sodium (COLACE) 100 MG capsule Take 100 mg by mouth 2 (two) times daily.     ketoconazole (NIZORAL) 2 % shampoo Apply 1 application topically 3 (three) times a week. Wash scalp 3 times a week, let sit 5 minutes before rinsing out 120 mL 4   Mesalamine (ASACOL HD) 800 MG TBEC Take by mouth.     Multiple Vitamins-Minerals (MULTIVITAMIN ADULT) TABS Take by mouth.     mupirocin ointment (BACTROBAN) 2 % Apply to wound once daily until healed 22 g 3   Omega-3 Fatty Acids (FISH OIL CONCENTRATE) 1000 MG CAPS Take 1 capsule by mouth daily.     omeprazole (PRILOSEC) 20 MG capsule Take 20 mg by mouth daily.     sildenafil (REVATIO) 20 MG tablet Take 20-100 mg by mouth as needed.     valsartan (DIOVAN) 320 MG tablet Take 320 mg by mouth daily.     No current facility-administered medications for this visit.    OBJECTIVE: Vitals:   10/30/19 1119  BP: (!) 164/86  Pulse: 80  Resp: 20  Temp: 97.6 F (36.4 C)  SpO2: 99%     Body mass index is 22.83 kg/m.    ECOG FS:0 - Asymptomatic  General: Well-developed, well-nourished, no acute distress. Eyes: Pink conjunctiva, anicteric sclera. HEENT:  Normocephalic, moist mucous membranes. Lungs: No audible wheezing or coughing. Heart: Regular rate and rhythm. Abdomen: Soft, nontender, no obvious distention. Musculoskeletal: No edema, cyanosis, or clubbing. Neuro: Alert, answering all questions appropriately. Cranial nerves grossly intact. Skin: No rashes or petechiae noted. Psych: Normal affect.  LAB RESULTS:  No results found for: NA, K, CL, CO2, GLUCOSE, BUN, CREATININE, CALCIUM, PROT, ALBUMIN, AST, ALT, ALKPHOS, BILITOT, GFRNONAA, GFRAA  Lab Results  Component Value Date   WBC 6.7 10/24/2019   NEUTROABS 4.3 10/24/2019   HGB 14.0 10/24/2019   HCT 40.4 10/24/2019   MCV 98.3 10/24/2019   PLT 174 10/24/2019     STUDIES: No results found.  ASSESSMENT: CLL.  PLAN:    1.  CLL: Patient's white blood cell count and lymphocyte count remain within normal limits.  Peripheral blood flow cytometry continues to detect a  small population, less than 1%, of clonal B cells consistent with CLL. All of his other laboratory work is either negative or within normal limits.  No intervention is needed at this time.  Patient does not require imaging or bone marrow biopsy.  Return to clinic in 1 year with repeat laboratory work and further evaluation.  I spent a total of 20 minutes reviewing chart data, face-to-face evaluation with the patient, counseling and coordination of care as detailed above.   Patient expressed understanding and was in agreement with this plan. He also understands that He can call clinic at any time with any questions, concerns, or complaints.    Lloyd Huger, MD   10/30/2019 4:55 PM

## 2019-10-28 LAB — COMP PANEL: LEUKEMIA/LYMPHOMA: Immunophenotypic Profile: 0

## 2019-10-30 ENCOUNTER — Inpatient Hospital Stay: Payer: Medicare PPO | Admitting: Oncology

## 2019-10-30 ENCOUNTER — Encounter: Payer: Self-pay | Admitting: Oncology

## 2019-10-30 ENCOUNTER — Other Ambulatory Visit: Payer: Self-pay

## 2019-10-30 VITALS — BP 164/86 | HR 80 | Temp 97.6°F | Resp 20 | Wt 154.6 lb

## 2019-10-30 DIAGNOSIS — C9110 Chronic lymphocytic leukemia of B-cell type not having achieved remission: Secondary | ICD-10-CM

## 2019-10-30 NOTE — Progress Notes (Signed)
Patient denied any problems or complications

## 2020-01-12 ENCOUNTER — Ambulatory Visit: Payer: Medicare PPO | Admitting: Dermatology

## 2020-01-12 ENCOUNTER — Other Ambulatory Visit: Payer: Self-pay

## 2020-01-12 DIAGNOSIS — L570 Actinic keratosis: Secondary | ICD-10-CM | POA: Diagnosis not present

## 2020-01-12 DIAGNOSIS — L820 Inflamed seborrheic keratosis: Secondary | ICD-10-CM

## 2020-01-12 DIAGNOSIS — L905 Scar conditions and fibrosis of skin: Secondary | ICD-10-CM

## 2020-01-12 DIAGNOSIS — L578 Other skin changes due to chronic exposure to nonionizing radiation: Secondary | ICD-10-CM | POA: Diagnosis not present

## 2020-01-12 NOTE — Progress Notes (Signed)
   Follow-Up Visit   Subjective  Grant Miranda is a 86 y.o. male who presents for the following: Follow-up (Patient here today for 3 month AK follow up. Areas treated at last visit with LN2 at scalp, face, ears, trunk and arms. ). Patient also had a spot at left face that is tender. Comes and goes for about 1 year. When he was seen last July he had a traumatic injury at the right lower leg. He would like it checked to make sure it is healing.   The following portions of the chart were reviewed this encounter and updated as appropriate:  Tobacco  Allergies  Meds  Problems  Med Hx  Surg Hx  Fam Hx     Review of Systems:  No other skin or systemic complaints except as noted in HPI or Assessment and Plan.  Objective  Well appearing patient in no apparent distress; mood and affect are within normal limits.  A focused examination was performed including face, neck, chest and back and right leg, scalp, arms. Relevant physical exam findings are noted in the Assessment and Plan.  Objective  Left postauricular area x 2, left neck x 1, trunk and extremities x 24 (27): Erythematous keratotic or waxy stuck-on papule or plaque.   Objective  Scalp, face, ears x 27 (27): Erythematous thin papules/macules with gritty scale.   Objective  Right Lower Leg: Dyspigmented smooth macule or patch.    Assessment & Plan  Inflamed seborrheic keratosis (27) Left postauricular area x 2, left neck x 1, trunk and extremities x 24  Destruction of lesion - Left postauricular area x 2, left neck x 1, trunk and extremities x 24 Complexity: simple   Destruction method: cryotherapy   Informed consent: discussed and consent obtained   Timeout:  patient name, date of birth, surgical site, and procedure verified Lesion destroyed using liquid nitrogen: Yes   Region frozen until ice ball extended beyond lesion: Yes   Outcome: patient tolerated procedure well with no complications   Post-procedure details:  wound care instructions given    AK (actinic keratosis) (27) Scalp, face, ears x 27  Destruction of lesion - Scalp, face, ears x 27 Complexity: simple   Destruction method: cryotherapy   Informed consent: discussed and consent obtained   Timeout:  patient name, date of birth, surgical site, and procedure verified Lesion destroyed using liquid nitrogen: Yes   Region frozen until ice ball extended beyond lesion: Yes   Outcome: patient tolerated procedure well with no complications   Post-procedure details: wound care instructions given    Scar Right Lower Leg  History of traumatic injury, no signs of infection   Actinic Damage - diffuse scaly erythematous macules with underlying dyspigmentation - Recommend daily broad spectrum sunscreen SPF 30+ to sun-exposed areas, reapply every 2 hours as needed.  - Call for new or changing lesions.   Return in about 3 months (around 04/13/2020) for AK follow up.  Graciella Belton, RMA, am acting as scribe for Sarina Ser, MD . Documentation: I have reviewed the above documentation for accuracy and completeness, and I agree with the above.  Sarina Ser, MD

## 2020-01-12 NOTE — Patient Instructions (Signed)
Cryotherapy Aftercare  . Wash gently with soap and water everyday.   . Apply Vaseline and Band-Aid daily until healed.  

## 2020-01-13 ENCOUNTER — Encounter: Payer: Self-pay | Admitting: Dermatology

## 2020-04-12 ENCOUNTER — Other Ambulatory Visit: Payer: Self-pay

## 2020-04-12 ENCOUNTER — Ambulatory Visit: Payer: Medicare PPO | Admitting: Dermatology

## 2020-04-12 DIAGNOSIS — L570 Actinic keratosis: Secondary | ICD-10-CM | POA: Diagnosis not present

## 2020-04-12 DIAGNOSIS — L2089 Other atopic dermatitis: Secondary | ICD-10-CM | POA: Diagnosis not present

## 2020-04-12 DIAGNOSIS — L820 Inflamed seborrheic keratosis: Secondary | ICD-10-CM

## 2020-04-12 DIAGNOSIS — L578 Other skin changes due to chronic exposure to nonionizing radiation: Secondary | ICD-10-CM

## 2020-04-12 MED ORDER — TACROLIMUS 0.1 % EX OINT: TOPICAL_OINTMENT | Freq: Two times a day (BID) | CUTANEOUS | 2 refills | Status: AC

## 2020-04-12 NOTE — Patient Instructions (Signed)

## 2020-04-12 NOTE — Progress Notes (Unsigned)
   Follow-Up Visit   Subjective  Grant Miranda is a 87 y.o. male who presents for the following: Rash (Face around eyes x ~1 week - very itchy) and Actinic Keratosis (3 month follow up of face, scalp, ears treated with LN2).  The following portions of the chart were reviewed this encounter and updated as appropriate:   Tobacco  Allergies  Meds  Problems  Med Hx  Surg Hx  Fam Hx     Review of Systems:  No other skin or systemic complaints except as noted in HPI or Assessment and Plan.  Objective  Well appearing patient in no apparent distress; mood and affect are within normal limits.  A focused examination was performed including face, scalp. Relevant physical exam findings are noted in the Assessment and Plan.  Objective  Head - Anterior (Face): Pinkness and scale  Objective  Scalp (8): Erythematous thin papules/macules with gritty scale.   Objective  Left scalp: Erythematous keratotic or waxy stuck-on papule or plaque.    Assessment & Plan    Actinic Damage - chronic, secondary to cumulative UV radiation exposure/sun exposure over time - diffuse scaly erythematous macules with underlying dyspigmentation - Recommend daily broad spectrum sunscreen SPF 30+ to sun-exposed areas, reapply every 2 hours as needed.  - Call for new or changing lesions.  Atopic dermatitis Head - Anterior (Face) Atopic dermatitis (eczema) is a chronic, relapsing, pruritic condition that can significantly affect quality of life. It is often associated with allergic rhinitis and/or asthma and can require treatment with topical medications, phototherapy, or in severe cases a biologic medication called Dupixent in older children and adults.   Start tacrolimus oint bid  tacrolimus (PROTOPIC) 0.1 % ointment - Head - Anterior (Face)  AK (actinic keratosis) (8) Scalp  Destruction of lesion - Scalp Complexity: simple   Destruction method: cryotherapy   Informed consent: discussed and consent  obtained   Timeout:  patient name, date of birth, surgical site, and procedure verified Lesion destroyed using liquid nitrogen: Yes   Region frozen until ice ball extended beyond lesion: Yes   Outcome: patient tolerated procedure well with no complications   Post-procedure details: wound care instructions given    Inflamed seborrheic keratosis Left scalp  Destruction of lesion - Left scalp Complexity: simple   Destruction method: cryotherapy   Informed consent: discussed and consent obtained   Timeout:  patient name, date of birth, surgical site, and procedure verified Lesion destroyed using liquid nitrogen: Yes   Region frozen until ice ball extended beyond lesion: Yes   Outcome: patient tolerated procedure well with no complications   Post-procedure details: wound care instructions given    Return in about 6 weeks (around 05/24/2020) for Follow up.  I, Ashok Cordia, CMA, am acting as scribe for Sarina Ser, MD .  Documentation: I have reviewed the above documentation for accuracy and completeness, and I agree with the above.  Sarina Ser, MD

## 2020-04-13 ENCOUNTER — Encounter: Payer: Self-pay | Admitting: Dermatology

## 2020-06-02 ENCOUNTER — Ambulatory Visit: Payer: Medicare PPO | Admitting: Dermatology

## 2020-07-07 ENCOUNTER — Ambulatory Visit: Payer: Medicare PPO | Admitting: Dermatology

## 2020-07-07 ENCOUNTER — Other Ambulatory Visit: Payer: Self-pay

## 2020-07-07 ENCOUNTER — Encounter: Payer: Self-pay | Admitting: Dermatology

## 2020-07-07 DIAGNOSIS — Z86006 Personal history of melanoma in-situ: Secondary | ICD-10-CM

## 2020-07-07 DIAGNOSIS — D229 Melanocytic nevi, unspecified: Secondary | ICD-10-CM

## 2020-07-07 DIAGNOSIS — Z1283 Encounter for screening for malignant neoplasm of skin: Secondary | ICD-10-CM

## 2020-07-07 DIAGNOSIS — L578 Other skin changes due to chronic exposure to nonionizing radiation: Secondary | ICD-10-CM

## 2020-07-07 DIAGNOSIS — L820 Inflamed seborrheic keratosis: Secondary | ICD-10-CM | POA: Diagnosis not present

## 2020-07-07 DIAGNOSIS — Z85828 Personal history of other malignant neoplasm of skin: Secondary | ICD-10-CM

## 2020-07-07 DIAGNOSIS — L814 Other melanin hyperpigmentation: Secondary | ICD-10-CM

## 2020-07-07 DIAGNOSIS — L570 Actinic keratosis: Secondary | ICD-10-CM

## 2020-07-07 DIAGNOSIS — L821 Other seborrheic keratosis: Secondary | ICD-10-CM | POA: Diagnosis not present

## 2020-07-07 DIAGNOSIS — D1800 Hemangioma unspecified site: Secondary | ICD-10-CM

## 2020-07-07 NOTE — Patient Instructions (Signed)

## 2020-07-07 NOTE — Progress Notes (Signed)
Follow-Up Visit   Subjective  Grant Miranda is a 87 y.o. male who presents for the following: Actinic Keratosis (Face, scalp, ears, 80m f/u) and Upper body skin exam (Hx of Melanoma IS Right lower medial knee, hx of BCC's). Patient with history of biopsy-proven benign lentigo of the right oral commissure. The patient presents for Upper Body Skin Exam (UBSE) for skin cancer screening and mole check.  The following portions of the chart were reviewed this encounter and updated as appropriate:   Tobacco  Allergies  Meds  Problems  Med Hx  Surg Hx  Fam Hx     Review of Systems:  No other skin or systemic complaints except as noted in HPI or Assessment and Plan.  Objective  Well appearing patient in no apparent distress; mood and affect are within normal limits.  All skin waist up examined.  Objective  face, scalp, ears, hands x 18 (18): Pink scaly macules   Objective  R oral comissure: Brown macule  Images       Assessment & Plan    Lentigines - Scattered tan macules - Due to sun exposure - Benign-appering, observe - Recommend daily broad spectrum sunscreen SPF 30+ to sun-exposed areas, reapply every 2 hours as needed. - Call for any changes  Seborrheic Keratoses - Stuck-on, waxy, tan-brown papules and/or plaques  - Benign-appearing - Discussed benign etiology and prognosis. - Observe - Call for any changes  Melanocytic Nevi - Tan-brown and/or pink-flesh-colored symmetric macules and papules - Benign appearing on exam today - Observation - Call clinic for new or changing moles - Recommend daily use of broad spectrum spf 30+ sunscreen to sun-exposed areas.   Hemangiomas - Red papules - Discussed benign nature - Observe - Call for any changes  Actinic Damage - Chronic condition, secondary to cumulative UV/sun exposure - diffuse scaly erythematous macules with underlying dyspigmentation - Recommend daily broad spectrum sunscreen SPF 30+ to  sun-exposed areas, reapply every 2 hours as needed.  - Staying in the shade or wearing long sleeves, sun glasses (UVA+UVB protection) and wide brim hats (4-inch brim around the entire circumference of the hat) are also recommended for sun protection.  - Call for new or changing lesions.  Skin cancer screening performed today.  History of Melanoma in Situ - R medial knee - No evidence of recurrence today - Recommend regular full body skin exams - Recommend daily broad spectrum sunscreen SPF 30+ to sun-exposed areas, reapply every 2 hours as needed.  - Call if any new or changing lesions are noted between office visits  History of Basal Cell Carcinoma of the Skin - No evidence of recurrence today - Recommend regular full body skin exams - Recommend daily broad spectrum sunscreen SPF 30+ to sun-exposed areas, reapply every 2 hours as needed.  - Call if any new or changing lesions are noted between office visits  AK (actinic keratosis) (18) face, scalp, ears, hands x 18  Destruction of lesion - face, scalp, ears, hands x 18 Complexity: simple   Destruction method: cryotherapy   Informed consent: discussed and consent obtained   Timeout:  patient name, date of birth, surgical site, and procedure verified Lesion destroyed using liquid nitrogen: Yes   Region frozen until ice ball extended beyond lesion: Yes   Outcome: patient tolerated procedure well with no complications   Post-procedure details: wound care instructions given    Inflamed seborrheic keratosis (5) scalp x 1, arms x 4  Destruction of lesion - scalp  x 1, arms x 4 Complexity: simple   Destruction method: cryotherapy   Informed consent: discussed and consent obtained   Timeout:  patient name, date of birth, surgical site, and procedure verified Lesion destroyed using liquid nitrogen: Yes   Region frozen until ice ball extended beyond lesion: Yes   Outcome: patient tolerated procedure well with no complications    Post-procedure details: wound care instructions given    Skin cancer screening  Solar lentigo R oral comissure Bx proven 11/13/18 Benign, observe See photo  Skin cancer screening  Return in about 4 months (around 11/06/2020) for AK f/u.  I, Othelia Pulling, RMA, am acting as scribe for Sarina Ser, MD .  Documentation: I have reviewed the above documentation for accuracy and completeness, and I agree with the above.  Sarina Ser, MD

## 2020-07-09 ENCOUNTER — Encounter: Payer: Self-pay | Admitting: Dermatology

## 2020-10-28 ENCOUNTER — Inpatient Hospital Stay: Payer: Medicare PPO | Attending: Oncology

## 2020-10-28 ENCOUNTER — Other Ambulatory Visit: Payer: Self-pay

## 2020-10-28 DIAGNOSIS — C9110 Chronic lymphocytic leukemia of B-cell type not having achieved remission: Secondary | ICD-10-CM | POA: Insufficient documentation

## 2020-10-28 LAB — CBC WITH DIFFERENTIAL/PLATELET
Abs Immature Granulocytes: 0.1 10*3/uL — ABNORMAL HIGH (ref 0.00–0.07)
Basophils Absolute: 0 10*3/uL (ref 0.0–0.1)
Basophils Relative: 1 %
Eosinophils Absolute: 0.2 10*3/uL (ref 0.0–0.5)
Eosinophils Relative: 4 %
HCT: 41.2 % (ref 39.0–52.0)
Hemoglobin: 14.3 g/dL (ref 13.0–17.0)
Immature Granulocytes: 2 %
Lymphocytes Relative: 21 %
Lymphs Abs: 1.3 10*3/uL (ref 0.7–4.0)
MCH: 34.6 pg — ABNORMAL HIGH (ref 26.0–34.0)
MCHC: 34.7 g/dL (ref 30.0–36.0)
MCV: 99.8 fL (ref 80.0–100.0)
Monocytes Absolute: 0.5 10*3/uL (ref 0.1–1.0)
Monocytes Relative: 9 %
Neutro Abs: 4 10*3/uL (ref 1.7–7.7)
Neutrophils Relative %: 63 %
Platelets: 164 10*3/uL (ref 150–400)
RBC: 4.13 MIL/uL — ABNORMAL LOW (ref 4.22–5.81)
RDW: 12.3 % (ref 11.5–15.5)
WBC: 6.1 10*3/uL (ref 4.0–10.5)
nRBC: 0 % (ref 0.0–0.2)

## 2020-10-30 NOTE — Progress Notes (Signed)
[place]  [phone] [phone]  ID: Grant Miranda OB: 08/24/1932  MR#: [id number]  BSW#:[id number]  Patient Care Team: [place] Pink, MD as PCP - General (Family Medicine)   CHIEF COMPLAINT: CLL.  INTERVAL HISTORY: Patient returns to clinic today for repeat laboratory work and yearly evaluation.  He continues to feel well and remains asymptomatic. He has no neurologic complaints.  He denies any recent fevers or illnesses.  He has a good appetite and denies weight loss.  He has no chest pain, shortness of breath, cough, or hemoptysis.  He denies any nausea, vomiting, constipation, or diarrhea.  He has no urinary complaints.  Patient feels at his baseline and offers no specific complaints today.  REVIEW OF SYSTEMS:   Review of Systems  Constitutional: Negative.  Negative for fever, malaise/fatigue and weight loss.  Respiratory: Negative.  Negative for cough, hemoptysis and shortness of breath.   Cardiovascular: Negative.  Negative for chest pain and leg swelling.  Gastrointestinal: Negative.  Negative for abdominal pain.  Genitourinary: Negative.  Negative for dysuria.  Musculoskeletal: Negative.  Negative for back pain.  Skin: Negative.  Negative for rash.  Neurological: Negative.  Negative for dizziness, focal weakness, weakness and headaches.  Psychiatric/Behavioral: Negative.  The patient is not nervous/anxious.    As per HPI. Otherwise, a complete review of systems is negative.  PAST MEDICAL HISTORY: Past Medical History:  Diagnosis Date   Actinic keratosis    Arthritis    Atypical melanocytic hyperplasia 11/25/2012   AMP. Right paraspinal lower back. Excised: 01/08/2013   Colon polyps    Esophageal stricture    GERD (gastroesophageal reflux disease)    History of basal cell cancer    left temple   History of melanoma    chest and leg   Hx of basal cell carcinoma 01/16/2018   Left ear on conchal side of the mid anti helix.  Nodular pattern   Lumbar disc herniation    Lumbar radiculitis 06/23/14   Melanoma ([place]) 09/07/2008   Right lower medial knee. MIS, LM type, margins free of tumor. Excision: 09/28/2008   Osteoarthritis of spine with radiculopathy, lumbar region 06/08/14   chronic   Prostate cancer ([place])    Positive for SEED implantation   Scoliosis 06/08/14   Degenerative scoliosis in adult patient; chronic   Spinal stenosis of lumbar region at multiple levels 06/08/14   Chronic   Squamous cell carcinoma of skin 11/10/2008   Left medial lower leg above ankle. Well differentiated. Excised: 12/02/2018, margins free   Squamous cell carcinoma of skin 05/24/2009   Right upper med. knee. Well differentiated.   Squamous cell carcinoma of skin 12/28/2009   Left vertex scalp. SCCis arising in AK   Squamous cell carcinoma of skin 09/11/2012   Right medial mid thigh. KA pattern   Squamous cell carcinoma of skin 09/11/2012   Left knee. Well differentiated.   Squamous cell carcinoma of skin 12/10/2014   Left medial proximal pretibial below knee. KA pattern   Squamous cell carcinoma of skin 12/01/2015   Right anterior crown. Horn with SCCis. Tx: EDC   Squamous cell carcinoma of skin 07/16/2017   Left upper eyelid. SCCis arising in AK   Squamous cell carcinoma of skin 06/25/2019   Left ant. lat. forehead at hairline. MD SCC, crusted.    Ulcerative colitis ([place])    left sided    PAST SURGICAL HISTORY: Past Surgical History:  Procedure Laterality Date   COLONOSCOPY  03/2004; 10/2007;  12/2008; 01/2010   PH of adenomatous polyps; FH colon polyps (sister)   COLONOSCOPY WITH PROPOFOL N/A 04/02/2015   Procedure: COLONOSCOPY WITH PROPOFOL;  Surgeon: Manya Silvas, MD;  Location: [facility];  Service: Endoscopy;  Laterality: N/A;   ESOPHAGOGASTRODUODENOSCOPY  02/05/2004   HERNIA REPAIR     JOINT REPLACEMENT Left 2006   Shoulder replacement   Pnuemovax  12/2009   Seed implant for treatment of prostate cancer      Performed in [place]: Family History  Problem Relation Age of Onset   Breast cancer Sister     ADVANCED DIRECTIVES (Y/N):  N  HEALTH MAINTENANCE: Social History   Tobacco Use   Smoking status: Never   Smokeless tobacco: Never  Substance Use Topics   Alcohol use: Yes    Alcohol/week: 0.0 standard drinks   Drug use: No     Colonoscopy:  PAP:  Bone density:  Lipid panel:  Allergies  Allergen Reactions   Hydrochlorothiazide Rash   Norvasc [Amlodipine] Rash   Penicillin V Potassium Rash   Sulfa Antibiotics Rash    Current Outpatient Medications  Medication Sig Dispense Refill   celecoxib (CELEBREX) 200 MG capsule Take 200 mg by mouth daily.      diphenhydramine-acetaminophen (TYLENOL PM) 25-500 MG TABS tablet Take 2 tablets by mouth at bedtime as needed.     docusate sodium (COLACE) 100 MG capsule Take 100 mg by mouth 2 (two) times daily.     ketoconazole (NIZORAL) 2 % shampoo Apply 1 application topically 3 (three) times a week. Wash scalp 3 times a week, let sit 5 minutes before rinsing out 120 mL 4   Mesalamine 800 MG TBEC Take by mouth.     Multiple Vitamins-Minerals (MULTIVITAMIN ADULT) TABS Take by mouth.     mupirocin ointment (BACTROBAN) 2 % Apply to wound once daily until healed 22 g 3   Omega-3 Fatty Acids (FISH OIL CONCENTRATE) 1000 MG CAPS Take 1 capsule by mouth daily.     omeprazole (PRILOSEC) 20 MG capsule Take 20 mg by mouth daily.     sildenafil (REVATIO) 20 MG tablet Take 20-100 mg by mouth as needed.     tacrolimus (PROTOPIC) 0.1 % ointment Apply topically in the morning and at bedtime. 60 g 2   valsartan (DIOVAN) 320 MG tablet Take 320 mg by mouth daily.     No current facility-administered medications for this visit.    OBJECTIVE: Vitals:   11/04/20 1101  BP: (!) 155/76  Pulse: 70  Resp: 20  Temp: 97.8 F (36.6 C)  SpO2: 100%     Body mass index is 22.12 kg/m.    ECOG FS:0 - Asymptomatic  General:  Well-developed, well-nourished, no acute distress. Eyes: Pink conjunctiva, anicteric sclera. HEENT: Normocephalic, moist mucous membranes. Lungs: No audible wheezing or coughing. Heart: Regular rate and rhythm. Abdomen: Soft, nontender, no obvious distention. Musculoskeletal: No edema, cyanosis, or clubbing. Neuro: Alert, answering all questions appropriately. Cranial nerves grossly intact. Skin: No rashes or petechiae noted. Psych: Normal affect.   LAB RESULTS:  No results found for: NA, K, CL, CO2, GLUCOSE, BUN, CREATININE, CALCIUM, PROT, ALBUMIN, AST, ALT, ALKPHOS, BILITOT, GFRNONAA, GFRAA  Lab Results  Component Value Date   WBC 6.1 10/28/2020   NEUTROABS 4.0 10/28/2020   HGB 14.3 10/28/2020   HCT 41.2 10/28/2020   MCV 99.8 10/28/2020   PLT 164 10/28/2020     STUDIES: No results found.  ASSESSMENT: CLL.  PLAN:    1.  CLL: Patient's white blood cell count and lymphocyte count remain within normal limits.  Repeat peripheral blood flow cytometry continues to report a CLL population of less than 1% of leukocytes.  Previously, all of his other laboratory work was either negative or within normal limits.  No intervention is needed at this time.  Patient does not require imaging or bone marrow biopsy.  Continue routine yearly follow-up.  I spent a total of 20 minutes reviewing chart data, face-to-face evaluation with the patient, counseling and coordination of care as detailed above.    Patient expressed understanding and was in agreement with this plan. He also understands that He can call clinic at any time with any questions, concerns, or complaints.    Lloyd Huger, MD   11/04/2020 3:31 PM

## 2020-11-02 LAB — COMP PANEL: LEUKEMIA/LYMPHOMA: Immunophenotypic Profile: 0

## 2020-11-04 ENCOUNTER — Inpatient Hospital Stay: Payer: Medicare PPO | Admitting: Oncology

## 2020-11-04 ENCOUNTER — Encounter: Payer: Self-pay | Admitting: Oncology

## 2020-11-04 VITALS — BP 155/76 | HR 70 | Temp 97.8°F | Resp 20 | Wt 149.8 lb

## 2020-11-04 DIAGNOSIS — C9110 Chronic lymphocytic leukemia of B-cell type not having achieved remission: Secondary | ICD-10-CM | POA: Diagnosis not present

## 2020-11-11 ENCOUNTER — Ambulatory Visit: Payer: Medicare PPO | Admitting: Dermatology

## 2020-11-11 ENCOUNTER — Other Ambulatory Visit: Payer: Self-pay

## 2020-11-11 ENCOUNTER — Encounter: Payer: Self-pay | Admitting: Dermatology

## 2020-11-11 DIAGNOSIS — L578 Other skin changes due to chronic exposure to nonionizing radiation: Secondary | ICD-10-CM | POA: Diagnosis not present

## 2020-11-11 DIAGNOSIS — L814 Other melanin hyperpigmentation: Secondary | ICD-10-CM

## 2020-11-11 DIAGNOSIS — Z1283 Encounter for screening for malignant neoplasm of skin: Secondary | ICD-10-CM

## 2020-11-11 DIAGNOSIS — L820 Inflamed seborrheic keratosis: Secondary | ICD-10-CM | POA: Diagnosis not present

## 2020-11-11 DIAGNOSIS — C44529 Squamous cell carcinoma of skin of other part of trunk: Secondary | ICD-10-CM | POA: Diagnosis not present

## 2020-11-11 DIAGNOSIS — L219 Seborrheic dermatitis, unspecified: Secondary | ICD-10-CM

## 2020-11-11 DIAGNOSIS — L570 Actinic keratosis: Secondary | ICD-10-CM | POA: Diagnosis not present

## 2020-11-11 DIAGNOSIS — D485 Neoplasm of uncertain behavior of skin: Secondary | ICD-10-CM

## 2020-11-11 MED ORDER — KETOCONAZOLE 2 % EX SHAM
1.0000 | MEDICATED_SHAMPOO | CUTANEOUS | 4 refills | Status: AC
Start: 2020-11-12 — End: ?

## 2020-11-11 NOTE — Progress Notes (Signed)
Follow-Up Visit   Subjective  Grant Miranda is a 87 y.o. male who presents for the following: Actinic Keratosis (4 month follow up scalp, face, ears and hands treated with LN2 x 18) and Follow-up (Seb derm follow up - Ketoconazole 2% shampoo - needs refills). The patient presents for Upper Body Skin Exam (UBSE) for skin cancer screening and mole check.  The following portions of the chart were reviewed this encounter and updated as appropriate:   Tobacco  Allergies  Meds  Problems  Med Hx  Surg Hx  Fam Hx     Review of Systems:  No other skin or systemic complaints except as noted in HPI or Assessment and Plan.  Objective  Well appearing patient in no apparent distress; mood and affect are within normal limits.  All skin waist up examined.  Right chest 1.2 cm hyperkeratotic papule  Scalp Pinkness  Face (18) Erythematous thin papules/macules with gritty scale.   Right oral commissure Light brown macule  Face (5) Erythematous keratotic or waxy stuck-on papule or plaque.    Assessment & Plan  Neoplasm of uncertain behavior of skin Right chest Epidermal / dermal shaving  Lesion diameter (cm):  1.2 Informed consent: discussed and consent obtained   Timeout: patient name, date of birth, surgical site, and procedure verified   Procedure prep:  Patient was prepped and draped in usual sterile fashion Prep type:  Isopropyl alcohol Anesthesia: the lesion was anesthetized in a standard fashion   Anesthetic:  1% lidocaine w/ epinephrine 1-100,000 buffered w/ 8.4% NaHCO3 Instrument used: flexible razor blade   Hemostasis achieved with: pressure, aluminum chloride and electrodesiccation   Outcome: patient tolerated procedure well   Post-procedure details: sterile dressing applied and wound care instructions given   Dressing type: bandage and petrolatum    Destruction of lesion Complexity: extensive  1.6 cm Destruction method: electrodesiccation and curettage    Informed consent: discussed and consent obtained   Timeout:  patient name, date of birth, surgical site, and procedure verified Procedure prep:  Patient was prepped and draped in usual sterile fashion Prep type:  Isopropyl alcohol Anesthesia: the lesion was anesthetized in a standard fashion   Anesthetic:  1% lidocaine w/ epinephrine 1-100,000 buffered w/ 8.4% NaHCO3 Curettage performed in three different directions: Yes   Electrodesiccation performed over the curetted area: Yes   Hemostasis achieved with:  pressure and aluminum chloride Outcome: patient tolerated procedure well with no complications   Post-procedure details: sterile dressing applied and wound care instructions given   Dressing type: bandage and petrolatum    Specimen 1 - Surgical pathology Differential Diagnosis: SCC vs other  Check Margins: No EDC today  Seborrheic dermatitis Scalp Seborrheic Dermatitis  -  is a chronic persistent rash characterized by pinkness and scaling most commonly of the mid face but also can occur on the scalp (dandruff), ears; mid chest and mid back. It tends to be exacerbated by stress and cooler weather.  People who have neurologic disease may experience new onset or exacerbation of existing seborrheic dermatitis.  The condition is not curable but treatable and can be controlled.  Contnue Ketoconazole 2% shampoo 3 times per week prn  ketoconazole (NIZORAL) 2 % shampoo - Scalp Apply 1 application topically 3 (three) times a week. Wash scalp 3 times a week, let sit 5 minutes before rinsing out  AK (actinic keratosis) (18) Face Destruction of lesion - Face Complexity: simple   Destruction method: cryotherapy   Informed consent: discussed and consent  obtained   Timeout:  patient name, date of birth, surgical site, and procedure verified Lesion destroyed using liquid nitrogen: Yes   Region frozen until ice ball extended beyond lesion: Yes   Outcome: patient tolerated procedure well with  no complications   Post-procedure details: wound care instructions given    Lentigines Right oral commissure Biopsy proven Lentigo - Benign See photo in chart.  No change.  Inflamed seborrheic keratosis Face Destruction of lesion - Face Complexity: simple   Destruction method: cryotherapy   Informed consent: discussed and consent obtained   Timeout:  patient name, date of birth, surgical site, and procedure verified Lesion destroyed using liquid nitrogen: Yes   Region frozen until ice ball extended beyond lesion: Yes   Outcome: patient tolerated procedure well with no complications   Post-procedure details: wound care instructions given    Actinic Damage - chronic, secondary to cumulative UV radiation exposure/sun exposure over time - diffuse scaly erythematous macules with underlying dyspigmentation - Recommend daily broad spectrum sunscreen SPF 30+ to sun-exposed areas, reapply every 2 hours as needed.  - Recommend staying in the shade or wearing long sleeves, sun glasses (UVA+UVB protection) and wide brim hats (4-inch brim around the entire circumference of the hat). - Call for new or changing lesions.  Skin cancer screening  Return in about 4 months (around 03/13/2021) for AK follow up.  I, Ashok Cordia, CMA, am acting as scribe for Sarina Ser, MD . Documentation: I have reviewed the above documentation for accuracy and completeness, and I agree with the above.  Sarina Ser, MD

## 2020-11-11 NOTE — Patient Instructions (Signed)
Cryotherapy Aftercare  Wash gently with soap and water everyday.   Apply Vaseline and Band-Aid daily until healed.    Wound Care Instructions  Cleanse wound gently with soap and water once a day then pat dry with clean gauze. Apply a thing coat of Petrolatum (petroleum jelly, "Vaseline") over the wound (unless you have an allergy to this). We recommend that you use a new, sterile tube of Vaseline. Do not pick or remove scabs. Do not remove the yellow or white "healing tissue" from the base of the wound.  Cover the wound with fresh, clean, nonstick gauze and secure with paper tape. You may use Band-Aids in place of gauze and tape if the would is small enough, but would recommend trimming much of the tape off as there is often too much. Sometimes Band-Aids can irritate the skin.  You should call the office for your biopsy report after 1 week if you have not already been contacted.  If you experience any problems, such as abnormal amounts of bleeding, swelling, significant bruising, significant pain, or evidence of infection, please call the office immediately.  FOR ADULT SURGERY PATIENTS: If you need something for pain relief you may take 1 extra strength Tylenol (acetaminophen) AND 2 Ibuprofen (200mg each) together every 4 hours as needed for pain. (do not take these if you are allergic to them or if you have a reason you should not take them.) Typically, you may only need pain medication for 1 to 3 days.     If you have any questions or concerns for your doctor, please call our main line at [phone] and press option 4 to reach your doctor's medical assistant. If no one answers, please leave a voicemail as directed and we will return your call as soon as possible. Messages left after 4 pm will be answered the following business day.   You may also send us a message via MyChart. We typically respond to MyChart messages within 1-2 business days.  For prescription refills, please ask your  pharmacy to contact our office. Our fax number is [phone].  If you have an urgent issue when the clinic is closed that cannot wait until the next business day, you can page your doctor at the number below.    Please note that while we do our best to be available for urgent issues outside of office hours, we are not available 24/7.   If you have an urgent issue and are unable to reach us, you may choose to seek medical care at your doctor's office, retail clinic, urgent care center, or emergency room.  If you have a medical emergency, please immediately call 911 or go to the emergency department.  Pager Numbers  - Dr. Kowalski: [phone]  - Dr. Moye: [phone]  - Dr. Stewart: [phone]  In the event of inclement weather, please call our main line at [phone] for an update on the status of any delays or closures.  Dermatology Medication Tips: Please keep the boxes that topical medications come in in order to help keep track of the instructions about where and how to use these. Pharmacies typically print the medication instructions only on the boxes and not directly on the medication tubes.   If your medication is too expensive, please contact our office at [phone] option 4 or send us a message through MyChart.   We are unable to tell what your co-pay for medications will be in advance as this is different depending on your insurance coverage.   However, we may be able to find a substitute medication at lower cost or fill out paperwork to get insurance to cover a needed medication.   If a prior authorization is required to get your medication covered by your insurance company, please allow us 1-2 business days to complete this process.  Drug prices often vary depending on where the prescription is filled and some pharmacies may offer cheaper prices.  The website www.goodrx.com contains coupons for medications through different pharmacies. The prices here do not  account for what the cost may be with help from insurance (it may be cheaper with your insurance), but the website can give you the price if you did not use any insurance.  - You can print the associated coupon and take it with your prescription to the pharmacy.  - You may also stop by our office during regular business hours and pick up a GoodRx coupon card.  - If you need your prescription sent electronically to a different pharmacy, notify our office through [facility] MyChart or by phone at [phone] option 4.  

## 2020-11-16 ENCOUNTER — Telehealth: Payer: Self-pay

## 2020-11-16 NOTE — Telephone Encounter (Signed)
Discussed biopsy results with pt  °

## 2020-11-16 NOTE — Telephone Encounter (Signed)
-----   Message from Ralene Bathe, MD sent at 11/16/2020  8:53 AM EDT ----- Diagnosis Skin , right chest SQUAMOUS CELL CARCINOMA, KERATOACANTHOMA TYPE  Cancer - SCC Already treated Recheck next visit

## 2020-12-30 ENCOUNTER — Encounter: Payer: Self-pay | Admitting: Ophthalmology

## 2021-01-11 NOTE — Discharge Instructions (Signed)

## 2021-01-12 ENCOUNTER — Ambulatory Visit: Payer: Medicare PPO | Admitting: Anesthesiology

## 2021-01-12 ENCOUNTER — Encounter
Admission: RE | Disposition: A | Discharge status: Home / Self Care | Payer: Self-pay | Source: Home / Self Care | Attending: Ophthalmology

## 2021-01-12 ENCOUNTER — Ambulatory Visit
Admission: RE | Admit: 2021-01-12 | Discharge: 2021-01-12 | Disposition: A | Discharge status: Home / Self Care | Payer: Medicare PPO | Attending: Ophthalmology | Admitting: Ophthalmology

## 2021-01-12 ENCOUNTER — Other Ambulatory Visit: Payer: Self-pay

## 2021-01-12 DIAGNOSIS — Z888 Allergy status to other drugs, medicaments and biological substances status: Secondary | ICD-10-CM | POA: Insufficient documentation

## 2021-01-12 DIAGNOSIS — Z880 Allergy status to penicillin: Secondary | ICD-10-CM | POA: Diagnosis not present

## 2021-01-12 DIAGNOSIS — Z79899 Other long term (current) drug therapy: Secondary | ICD-10-CM | POA: Insufficient documentation

## 2021-01-12 DIAGNOSIS — Z882 Allergy status to sulfonamides status: Secondary | ICD-10-CM | POA: Diagnosis not present

## 2021-01-12 DIAGNOSIS — Z791 Long term (current) use of non-steroidal anti-inflammatories (NSAID): Secondary | ICD-10-CM | POA: Insufficient documentation

## 2021-01-12 DIAGNOSIS — H2512 Age-related nuclear cataract, left eye: Secondary | ICD-10-CM | POA: Diagnosis present

## 2021-01-12 HISTORY — DX: Essential (primary) hypertension: I10

## 2021-01-12 HISTORY — PX: CATARACT EXTRACTION W/PHACO: SHX586

## 2021-01-12 SURGERY — PHACOEMULSIFICATION, CATARACT, WITH IOL INSERTION
Anesthesia: Monitor Anesthesia Care | Site: Eye | Laterality: Left

## 2021-01-12 MED ORDER — ARMC OPHTHALMIC DILATING DROPS
1.0000 "application " | OPHTHALMIC | Status: DC | PRN
  Administered 2021-01-12 (×3): 1 via OPHTHALMIC

## 2021-01-12 MED ORDER — MOXIFLOXACIN HCL 0.5 % OP SOLN
OPHTHALMIC | Status: DC | PRN
  Administered 2021-01-12: 0.2 mL via OPHTHALMIC

## 2021-01-12 MED ORDER — MIDAZOLAM HCL 2 MG/2ML IJ SOLN
INTRAMUSCULAR | Status: DC | PRN
  Administered 2021-01-12: 1 mg via INTRAVENOUS

## 2021-01-12 MED ORDER — LACTATED RINGERS IV SOLN: INTRAVENOUS | Status: DC

## 2021-01-12 MED ORDER — SIGHTPATH DOSE#1 NA HYALUR & NA CHOND-NA HYALUR IO KIT
PACK | INTRAOCULAR | Status: DC | PRN
  Administered 2021-01-12: 1 via OPHTHALMIC

## 2021-01-12 MED ORDER — SIGHTPATH DOSE#1 BSS IO SOLN
INTRAOCULAR | Status: DC | PRN
  Administered 2021-01-12: 15 mL

## 2021-01-12 MED ORDER — SIGHTPATH DOSE#1 BSS IO SOLN
INTRAOCULAR | Status: DC | PRN
  Administered 2021-01-12: 1 mL via INTRAMUSCULAR

## 2021-01-12 MED ORDER — SIGHTPATH DOSE#1 BSS IO SOLN
INTRAOCULAR | Status: DC | PRN
  Administered 2021-01-12: 77 mL via OPHTHALMIC

## 2021-01-12 MED ORDER — BRIMONIDINE TARTRATE-TIMOLOL 0.2-0.5 % OP SOLN
OPHTHALMIC | Status: DC | PRN
  Administered 2021-01-12: 1 [drp] via OPHTHALMIC

## 2021-01-12 MED ORDER — FENTANYL CITRATE (PF) 100 MCG/2ML IJ SOLN
INTRAMUSCULAR | Status: DC | PRN
  Administered 2021-01-12 (×2): 25 ug via INTRAVENOUS

## 2021-01-12 MED ORDER — TETRACAINE HCL 0.5 % OP SOLN
1.0000 [drp] | OPHTHALMIC | Status: DC | PRN
  Administered 2021-01-12 (×3): 1 [drp] via OPHTHALMIC

## 2021-01-12 SURGICAL SUPPLY — 14 items
CANNULA ANT/CHMB 27GA (MISCELLANEOUS) ×2 IMPLANT
GLOVE SRG 8 PF TXTR STRL LF DI (GLOVE) ×1 IMPLANT
GLOVE SURG ENC TEXT LTX SZ7.5 (GLOVE) ×2 IMPLANT
GLOVE SURG UNDER POLY LF SZ8 (GLOVE) ×2
GOWN STRL REUS W/ TWL LRG LVL3 (GOWN DISPOSABLE) ×2 IMPLANT
GOWN STRL REUS W/TWL LRG LVL3 (GOWN DISPOSABLE) ×4
LENS IOL TECNIS EYHANCE 17.5 (Intraocular Lens) ×2 IMPLANT
MARKER SKIN DUAL TIP RULER LAB (MISCELLANEOUS) ×2 IMPLANT
NEEDLE FILTER BLUNT 18X 1/2SAF (NEEDLE) ×2
NEEDLE FILTER BLUNT 18X1 1/2 (NEEDLE) ×2 IMPLANT
SYR 3ML LL SCALE MARK (SYRINGE) ×4 IMPLANT
SYR TB 1ML LUER SLIP (SYRINGE) ×2 IMPLANT
WATER STERILE IRR 250ML POUR (IV SOLUTION) ×2 IMPLANT
WIPE NON LINTING 3.25X3.25 (MISCELLANEOUS) ×2 IMPLANT

## 2021-01-12 NOTE — H&P (Signed)
[facility]   Primary Care Physician:  [place] Pink, MD Ophthalmologist: Dr. Leandrew Koyanagi  Pre-Procedure History & Physical: HPI:  Grant Miranda is a 87 y.o. male here for ophthalmic surgery.   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 Right paraspinal lower back. Excised: 01/08/2013   Colon polyps    Esophageal stricture    GERD (gastroesophageal reflux disease)    History of basal cell cancer    left temple   History of melanoma    chest and leg   Hx of basal cell carcinoma 01/16/2018   Left ear on conchal side of the mid anti helix. Nodular pattern   Hypertension    Lumbar disc herniation    Lumbar radiculitis 06/23/2014   Melanoma ([place]) 09/07/2008   Right lower medial knee. MIS, LM type, margins free of tumor. Excision: 09/28/2008   Osteoarthritis of spine with radiculopathy, lumbar region 06/08/2014   chronic   Prostate cancer ([place])    Positive for SEED implantation   Scoliosis 06/08/2014   Degenerative scoliosis in adult patient; chronic   Spinal stenosis of lumbar region at multiple levels 06/08/2014   Chronic   Squamous cell carcinoma of skin 11/10/2008   Left medial lower leg above ankle. Well differentiated. Excised: 12/02/2018, margins free   Squamous cell carcinoma of skin 05/24/2009   Right upper med. knee. Well differentiated.   Squamous cell carcinoma of skin 12/28/2009   Left vertex scalp. SCCis arising in AK   Squamous cell carcinoma of skin 09/11/2012   Right medial mid thigh. KA pattern   Squamous cell carcinoma of skin 09/11/2012   Left knee. Well differentiated.   Squamous cell carcinoma of skin 12/10/2014   Left medial proximal pretibial below knee. KA pattern   Squamous cell carcinoma of skin 12/01/2015   Right anterior crown. Horn with SCCis. Tx: EDC   Squamous cell carcinoma of skin 07/16/2017   Left upper eyelid. SCCis arising in AK    Squamous cell carcinoma of skin 06/25/2019   Left ant. lat. forehead at hairline. MD SCC, crusted.    Squamous cell carcinoma of skin 11/11/2020   left chest, EDC   Ulcerative colitis ([place])    left sided    Past Surgical History:  Procedure Laterality Date   COLONOSCOPY  03/2004; 10/2007; 12/2008; 01/2010   PH of adenomatous polyps; FH colon polyps (sister)   COLONOSCOPY WITH PROPOFOL N/A 04/02/2015   Procedure: COLONOSCOPY WITH PROPOFOL;  Surgeon: Manya Silvas, MD;  Location: [facility];  Service: Endoscopy;  Laterality: N/A;   ESOPHAGOGASTRODUODENOSCOPY  02/05/2004   HERNIA REPAIR     JOINT REPLACEMENT Left 2006   Shoulder replacement   Pnuemovax  12/2009   Seed implant for treatment of prostate cancer     Performed in [facility]      Prior to Admission medications   Medication Sig Start Date End Date Taking? Authorizing Provider  ACETAMINOPHEN 8 HOUR PO Take 650 mg by mouth as needed.   Yes [provider]  celecoxib (CELEBREX) 200 MG capsule Take 200 mg by mouth daily.    Yes [provider]  diphenhydramine-acetaminophen (TYLENOL PM) 25-500 MG TABS tablet Take 2 tablets by mouth at bedtime as needed.   Yes [provider]  docusate sodium (COLACE) 100 MG capsule Take 100 mg by mouth 2 (two) times daily.   Yes [provider]  Mesalamine 800 MG TBEC Take by mouth.   Yes [provider]  Multiple Vitamins-Minerals (MULTIVITAMIN ADULT) TABS Take by mouth.   Yes [provider]  Omega-3 Fatty Acids (FISH OIL CONCENTRATE) 1000 MG CAPS Take 1 capsule by mouth daily.   Yes [provider]  omeprazole (PRILOSEC) 20 MG capsule Take 20 mg by mouth daily.   Yes [provider]  valsartan (DIOVAN) 320 MG tablet Take 320 mg by mouth daily.   Yes [provider]  Zinc Sulfate (ZINC 15 PO) Take 30 mg by mouth daily.   Yes [provider]  ketoconazole (NIZORAL) 2 % shampoo Apply 1  application topically 3 (three) times a week. Wash scalp 3 times a week, let sit 5 minutes before rinsing out 11/12/20   Ralene Bathe, MD  mupirocin ointment Drue Stager) 2 % Apply to wound once daily until healed 10/01/19   Ralene Bathe, MD  sildenafil (REVATIO) 20 MG tablet Take 20-100 mg by mouth as needed. Patient not taking: Reported on 12/30/2020    [provider]  tacrolimus (PROTOPIC) 0.1 % ointment Apply topically in the morning and at bedtime. 04/12/20   Ralene Bathe, MD    Allergies as of 11/16/2020 - Review Complete 11/11/2020  Allergen Reaction Noted   Hydrochlorothiazide Rash 04/01/2015   Norvasc [amlodipine] Rash 04/01/2015   Penicillin v potassium Rash 04/01/2015   Sulfa antibiotics Rash 04/01/2015    Family History  Problem Relation Age of Onset   Breast cancer Sister     Social History   Socioeconomic History   Marital status: Single    Spouse name: Not on file   Number of children: Not on file   Years of education: Not on file   Highest education level: Not on file  Occupational History   Occupation: retired  Tobacco Use   Smoking status: Never   Smokeless tobacco: Never  Vaping Use   Vaping Use: Never used  Substance and Sexual Activity   Alcohol use: Yes    Alcohol/week: 1.0 standard drink    Types: 1 Glasses of wine per week   Drug use: No   Sexual activity: Yes  Other Topics Concern   Not on file  Social History Narrative   Not on file   Social Determinants of Health   Financial Resource Strain: Not on file  Food Insecurity: Not on file  Transportation Needs: Not on file  Physical Activity: Not on file  Stress: Not on file  Social Connections: Not on file  Intimate Partner Violence: Not on file    Review of Systems: See HPI, otherwise negative ROS  Physical Exam: BP (!) 192/72   Pulse 78   Temp 97.9 F (36.6 C) (Oral)   Resp 18   Ht 5\' 9"  (1.753 m)   Wt 65 kg   SpO2 100%   BMI 21.18 kg/m  General:    Alert,  pleasant and cooperative in NAD Head:  Normocephalic and atraumatic. Lungs:  Clear to auscultation.    Heart:  Regular rate and rhythm.   Impression/Plan: Grant Miranda is here for ophthalmic surgery.  Risks, benefits, limitations, and alternatives regarding ophthalmic surgery have been reviewed with the patient.  Questions have been answered.  All parties agreeable.   Leandrew Koyanagi, MD  01/12/2021, 11:18 AM

## 2021-01-12 NOTE — Anesthesia Postprocedure Evaluation (Signed)
Anesthesia Post Note  Patient: Grant Miranda  Procedure(s) Performed: CATARACT EXTRACTION PHACO AND INTRAOCULAR LENS PLACEMENT (IOC) LEFT (Left: Eye)     Patient location during evaluation: PACU Anesthesia Type: MAC Level of consciousness: awake and alert Pain management: pain level controlled Vital Signs Assessment: post-procedure vital signs reviewed and stable Respiratory status: spontaneous breathing Cardiovascular status: stable Anesthetic complications: no   No notable events documented.  Gillian Scarce

## 2021-01-12 NOTE — Op Note (Signed)
OPERATIVE NOTE  Grant Miranda [id number] 01/12/2021   PREOPERATIVE DIAGNOSIS:  Nuclear sclerotic cataract left eye. H25.12   POSTOPERATIVE DIAGNOSIS:    Nuclear sclerotic cataract left eye.     PROCEDURE:  Phacoemusification with posterior chamber intraocular lens placement of the left eye  Ultrasound time: Procedure(s) with comments: CATARACT EXTRACTION PHACO AND INTRAOCULAR LENS PLACEMENT (IOC) LEFT (Left) - 7.13 01:05.3  LENS:   Implant Name Type Inv. Item Serial No. Manufacturer Lot No. LRB No. Used Action  LENS IOL TECNIS EYHANCE 17.5 - [id number] Intraocular Lens LENS IOL TECNIS EYHANCE 17.5 [id number] JOHNSON   Left 1 Implanted      SURGEON:  Wyonia Hough, MD   ANESTHESIA:  Topical with tetracaine drops and 2% Xylocaine jelly, augmented with 1% preservative-free intracameral lidocaine.    COMPLICATIONS:  None.   DESCRIPTION OF PROCEDURE:  The patient was identified in the holding room and transported to the operating room and placed in the supine position under the operating microscope.  The left eye was identified as the operative eye and it was prepped and draped in the usual sterile ophthalmic fashion.   A 1 millimeter clear-corneal paracentesis was made at the 1:30 position.  0.5 ml of preservative-free 1% lidocaine was injected into the anterior chamber.  The anterior chamber was filled with Viscoat viscoelastic.  A 2.4 millimeter keratome was used to make a near-clear corneal incision at the 10:30 position.  .  A curvilinear capsulorrhexis was made with a cystotome and capsulorrhexis forceps.  Balanced salt solution was used to hydrodissect and hydrodelineate the nucleus.   Phacoemulsification was then used in stop and chop fashion to remove the lens nucleus and epinucleus.  The remaining cortex was then removed using the irrigation and aspiration handpiece. Provisc was then placed into the capsular bag to distend it for lens placement.  A lens was then  injected into the capsular bag.  The remaining viscoelastic was aspirated.   Wounds were hydrated with balanced salt solution.  The anterior chamber was inflated to a physiologic pressure with balanced salt solution.  No wound leaks were noted. Vigamox 0.2 ml of a 1mg  per ml solution was injected into the anterior chamber for a dose of 0.2 mg of intracameral antibiotic at the completion of the case.   Timolol and Brimonidine drops were applied to the eye.  The patient was taken to the recovery room in stable condition without complications of anesthesia or surgery.  [name] 01/12/2021, 12:37 PM

## 2021-01-12 NOTE — Anesthesia Preprocedure Evaluation (Signed)
Anesthesia Evaluation  Patient identified by MRN, date of birth, ID band Patient awake    Reviewed: Allergy & Precautions, H&P , NPO status , Patient's Chart, lab work & pertinent test results  Airway Mallampati: II  TM Distance: >3 FB Neck ROM: full    Dental no notable dental hx.    Pulmonary neg pulmonary ROS,    Pulmonary exam normal        Cardiovascular hypertension, On Medications Normal cardiovascular exam Rhythm:regular Rate:Normal     Neuro/Psych negative neurological ROS     GI/Hepatic Neg liver ROS, Medicated,  Endo/Other  negative endocrine ROS  Renal/GU negative Renal ROS  negative genitourinary   Musculoskeletal   Abdominal   Peds  Hematology negative hematology ROS (+)   Anesthesia Other Findings   Reproductive/Obstetrics                             Anesthesia Physical Anesthesia Plan  ASA: 2  Anesthesia Plan: MAC   Post-op Pain Management:    Induction:   PONV Risk Score and Plan: 1 and TIVA, Midazolam and Treatment may vary due to age or medical condition  Airway Management Planned:   Additional Equipment:   Intra-op Plan:   Post-operative Plan:   Informed Consent: I have reviewed the patients History and Physical, chart, labs and discussed the procedure including the risks, benefits and alternatives for the proposed anesthesia with the patient or authorized representative who has indicated his/her understanding and acceptance.       Plan Discussed with:   Anesthesia Plan Comments:         Anesthesia Quick Evaluation

## 2021-01-12 NOTE — Transfer of Care (Signed)
Immediate Anesthesia Transfer of Care Note  Patient: Grant Miranda  Procedure(s) Performed: CATARACT EXTRACTION PHACO AND INTRAOCULAR LENS PLACEMENT (IOC) LEFT (Left: Eye)  Patient Location: PACU  Anesthesia Type: MAC  Level of Consciousness: awake, alert  and patient cooperative  Airway and Oxygen Therapy: Patient Spontanous Breathing and Patient connected to supplemental oxygen  Post-op Assessment: Post-op Vital signs reviewed, Patient's Cardiovascular Status Stable, Respiratory Function Stable, Patent Airway and No signs of Nausea or vomiting  Post-op Vital Signs: Reviewed and stable  Complications: No notable events documented.

## 2021-01-13 ENCOUNTER — Encounter: Payer: Self-pay | Admitting: Ophthalmology

## 2021-01-24 NOTE — Discharge Instructions (Signed)

## 2021-01-26 ENCOUNTER — Ambulatory Visit
Admission: RE | Admit: 2021-01-26 | Discharge: 2021-01-26 | Disposition: A | Discharge status: Home / Self Care | Payer: Medicare PPO | Attending: Ophthalmology | Admitting: Ophthalmology

## 2021-01-26 ENCOUNTER — Other Ambulatory Visit: Payer: Self-pay

## 2021-01-26 ENCOUNTER — Ambulatory Visit: Payer: Medicare PPO | Admitting: Anesthesiology

## 2021-01-26 ENCOUNTER — Encounter: Payer: Self-pay | Admitting: Ophthalmology

## 2021-01-26 ENCOUNTER — Encounter
Admission: RE | Disposition: A | Discharge status: Home / Self Care | Payer: Self-pay | Source: Home / Self Care | Attending: Ophthalmology

## 2021-01-26 DIAGNOSIS — Z79899 Other long term (current) drug therapy: Secondary | ICD-10-CM | POA: Insufficient documentation

## 2021-01-26 DIAGNOSIS — H2511 Age-related nuclear cataract, right eye: Secondary | ICD-10-CM | POA: Insufficient documentation

## 2021-01-26 DIAGNOSIS — I10 Essential (primary) hypertension: Secondary | ICD-10-CM | POA: Diagnosis not present

## 2021-01-26 HISTORY — PX: CATARACT EXTRACTION W/PHACO: SHX586

## 2021-01-26 SURGERY — PHACOEMULSIFICATION, CATARACT, WITH IOL INSERTION
Anesthesia: Monitor Anesthesia Care | Site: Eye | Laterality: Right

## 2021-01-26 MED ORDER — TETRACAINE HCL 0.5 % OP SOLN
1.0000 [drp] | OPHTHALMIC | Status: DC | PRN
  Administered 2021-01-26 (×3): 1 [drp] via OPHTHALMIC

## 2021-01-26 MED ORDER — FENTANYL CITRATE (PF) 100 MCG/2ML IJ SOLN
INTRAMUSCULAR | Status: DC | PRN
  Administered 2021-01-26: 50 ug via INTRAVENOUS

## 2021-01-26 MED ORDER — LACTATED RINGERS IV SOLN: INTRAVENOUS | Status: DC

## 2021-01-26 MED ORDER — MOXIFLOXACIN HCL 0.5 % OP SOLN
OPHTHALMIC | Status: DC | PRN
  Administered 2021-01-26: 0.2 mL via OPHTHALMIC

## 2021-01-26 MED ORDER — MIDAZOLAM HCL 2 MG/2ML IJ SOLN
INTRAMUSCULAR | Status: DC | PRN
  Administered 2021-01-26: 1 mg via INTRAVENOUS

## 2021-01-26 MED ORDER — SIGHTPATH DOSE#1 BSS IO SOLN
INTRAOCULAR | Status: DC | PRN
  Administered 2021-01-26: 2 mL

## 2021-01-26 MED ORDER — SIGHTPATH DOSE#1 BSS IO SOLN
INTRAOCULAR | Status: DC | PRN
  Administered 2021-01-26: 15 mL via INTRAOCULAR

## 2021-01-26 MED ORDER — SIGHTPATH DOSE#1 BSS IO SOLN
INTRAOCULAR | Status: DC | PRN
  Administered 2021-01-26: 70 mL via OPHTHALMIC

## 2021-01-26 MED ORDER — BRIMONIDINE TARTRATE-TIMOLOL 0.2-0.5 % OP SOLN
OPHTHALMIC | Status: DC | PRN
  Administered 2021-01-26: 1 [drp] via OPHTHALMIC

## 2021-01-26 MED ORDER — ARMC OPHTHALMIC DILATING DROPS
1.0000 "application " | OPHTHALMIC | Status: DC | PRN
  Administered 2021-01-26 (×3): 1 via OPHTHALMIC

## 2021-01-26 MED ORDER — SIGHTPATH DOSE#1 NA HYALUR & NA CHOND-NA HYALUR IO KIT
PACK | INTRAOCULAR | Status: DC | PRN
  Administered 2021-01-26: 1 via OPHTHALMIC

## 2021-01-26 SURGICAL SUPPLY — 16 items
CANNULA ANT/CHMB 27GA (MISCELLANEOUS) IMPLANT
GLOVE SRG 8 PF TXTR STRL LF DI (GLOVE) ×1 IMPLANT
GLOVE SURG ENC TEXT LTX SZ7.5 (GLOVE) ×2 IMPLANT
GLOVE SURG UNDER POLY LF SZ8 (GLOVE) ×2
GOWN STRL REUS W/ TWL LRG LVL3 (GOWN DISPOSABLE) ×2 IMPLANT
GOWN STRL REUS W/TWL LRG LVL3 (GOWN DISPOSABLE) ×4
LENS IOL TECNIS EYHANCE 17.0 (Intraocular Lens) ×2 IMPLANT
MARKER SKIN DUAL TIP RULER LAB (MISCELLANEOUS) ×2 IMPLANT
NEEDLE CAPSULORHEX 25GA (NEEDLE) IMPLANT
NEEDLE FILTER BLUNT 18X 1/2SAF (NEEDLE) ×2
NEEDLE FILTER BLUNT 18X1 1/2 (NEEDLE) ×2 IMPLANT
PACK EYE AFTER SURG (MISCELLANEOUS) IMPLANT
SYR 3ML LL SCALE MARK (SYRINGE) ×4 IMPLANT
SYR TB 1ML LUER SLIP (SYRINGE) ×2 IMPLANT
WATER STERILE IRR 250ML POUR (IV SOLUTION) ×2 IMPLANT
WIPE NON LINTING 3.25X3.25 (MISCELLANEOUS) ×2 IMPLANT

## 2021-01-26 NOTE — Transfer of Care (Signed)
Immediate Anesthesia Transfer of Care Note  Patient: Grant Miranda  Procedure(s) Performed: CATARACT EXTRACTION PHACO AND INTRAOCULAR LENS PLACEMENT (IOC) RIGHT 12.79 01:36.0 (Right: Eye)  Patient Location: PACU  Anesthesia Type: MAC  Level of Consciousness: awake, alert  and patient cooperative  Airway and Oxygen Therapy: Patient Spontanous Breathing and Patient connected to supplemental oxygen  Post-op Assessment: Post-op Vital signs reviewed, Patient's Cardiovascular Status Stable, Respiratory Function Stable, Patent Airway and No signs of Nausea or vomiting  Post-op Vital Signs: Reviewed and stable  Complications: No notable events documented.

## 2021-01-26 NOTE — Anesthesia Postprocedure Evaluation (Signed)
Anesthesia Post Note  Patient: Grant Miranda  Procedure(s) Performed: CATARACT EXTRACTION PHACO AND INTRAOCULAR LENS PLACEMENT (IOC) RIGHT 12.79 01:36.0 (Right: Eye)     Patient location during evaluation: PACU Anesthesia Type: MAC Level of consciousness: awake and alert Pain management: pain level controlled Vital Signs Assessment: post-procedure vital signs reviewed and stable Respiratory status: spontaneous breathing Cardiovascular status: stable Anesthetic complications: no   No notable events documented.  Gillian Scarce

## 2021-01-26 NOTE — Op Note (Signed)
  LOCATION:  [place]   PREOPERATIVE DIAGNOSIS:    Nuclear sclerotic cataract right eye. H25.11   POSTOPERATIVE DIAGNOSIS:  Nuclear sclerotic cataract right eye.     PROCEDURE:  Phacoemusification with posterior chamber intraocular lens placement of the right eye   ULTRASOUND TIME: Procedure(s): CATARACT EXTRACTION PHACO AND INTRAOCULAR LENS PLACEMENT (IOC) RIGHT 12.79 01:36.0 (Right)  LENS:   Implant Name Type Inv. Item Serial No. Manufacturer Lot No. LRB No. Used Action  LENS IOL TECNIS EYHANCE 17.0 - [id number] Intraocular Lens LENS IOL TECNIS EYHANCE 17.0 [id number] JOHNSON   Right 1 Implanted         SURGEON:  Wyonia Hough, MD   ANESTHESIA:  Topical with tetracaine drops and 2% Xylocaine jelly, augmented with 1% preservative-free intracameral lidocaine.    COMPLICATIONS:  None.   DESCRIPTION OF PROCEDURE:  The patient was identified in the holding room and transported to the operating room and placed in the supine position under the operating microscope.  The right eye was identified as the operative eye and it was prepped and draped in the usual sterile ophthalmic fashion.   A 1 millimeter clear-corneal paracentesis was made at the 12:00 position.  0.5 ml of preservative-free 1% lidocaine was injected into the anterior chamber. The anterior chamber was filled with Viscoat viscoelastic.  A 2.4 millimeter keratome was used to make a near-clear corneal incision at the 9:00 position.  A curvilinear capsulorrhexis was made with a cystotome and capsulorrhexis forceps.  Balanced salt solution was used to hydrodissect and hydrodelineate the nucleus.   Phacoemulsification was then used in stop and chop fashion to remove the lens nucleus and epinucleus.  The remaining cortex was then removed using the irrigation and aspiration handpiece. Provisc was then placed into the capsular bag to distend it for lens placement.  A lens was then injected into the capsular bag.   The remaining viscoelastic was aspirated.   Wounds were hydrated with balanced salt solution.  The anterior chamber was inflated to a physiologic pressure with balanced salt solution.  No wound leaks were noted. Cefuroxime 0.1 ml of a 10mg /ml solution was injected into the anterior chamber for a dose of 1 mg of intracameral antibiotic at the completion of the case.   Timolol and Brimonidine drops were applied to the eye.  The patient was taken to the recovery room in stable condition without complications of anesthesia or surgery.   Grant Miranda 01/26/2021, 10:42 AM

## 2021-01-26 NOTE — H&P (Signed)
[facility]   Primary Care Physician:  [place] Pink, MD Ophthalmologist: Dr. Leandrew Koyanagi  Pre-Procedure History & Physical: HPI:  Grant Miranda is a 87 y.o. male here for ophthalmic surgery.   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 Right paraspinal lower back. Excised: 01/08/2013   Colon polyps    Esophageal stricture    GERD (gastroesophageal reflux disease)    History of basal cell cancer    left temple   History of melanoma    chest and leg   Hx of basal cell carcinoma 01/16/2018   Left ear on conchal side of the mid anti helix. Nodular pattern   Hypertension    Lumbar disc herniation    Lumbar radiculitis 06/23/2014   Melanoma ([place]) 09/07/2008   Right lower medial knee. MIS, LM type, margins free of tumor. Excision: 09/28/2008   Osteoarthritis of spine with radiculopathy, lumbar region 06/08/2014   chronic   Prostate cancer ([place])    Positive for SEED implantation   Scoliosis 06/08/2014   Degenerative scoliosis in adult patient; chronic   Spinal stenosis of lumbar region at multiple levels 06/08/2014   Chronic   Squamous cell carcinoma of skin 11/10/2008   Left medial lower leg above ankle. Well differentiated. Excised: 12/02/2018, margins free   Squamous cell carcinoma of skin 05/24/2009   Right upper med. knee. Well differentiated.   Squamous cell carcinoma of skin 12/28/2009   Left vertex scalp. SCCis arising in AK   Squamous cell carcinoma of skin 09/11/2012   Right medial mid thigh. KA pattern   Squamous cell carcinoma of skin 09/11/2012   Left knee. Well differentiated.   Squamous cell carcinoma of skin 12/10/2014   Left medial proximal pretibial below knee. KA pattern   Squamous cell carcinoma of skin 12/01/2015   Right anterior crown. [place] with SCCis. Tx: EDC   Squamous cell carcinoma of skin 07/16/2017   Left upper eyelid. SCCis arising in AK    Squamous cell carcinoma of skin 06/25/2019   Left ant. lat. forehead at hairline. MD SCC, crusted.    Squamous cell carcinoma of skin 11/11/2020   left chest, EDC   Ulcerative colitis ([place])    left sided    Past Surgical History:  Procedure Laterality Date   CATARACT EXTRACTION W/PHACO Left 01/12/2021   Procedure: CATARACT EXTRACTION PHACO AND INTRAOCULAR LENS PLACEMENT ([place]) LEFT;  Surgeon: Leandrew Koyanagi, MD;  Location: [facility];  Service: Ophthalmology;  Laterality: Left;  7.13 01:05.3   COLONOSCOPY  03/2004; 10/2007; 12/2008; 01/2010   PH of adenomatous polyps; FH colon polyps (sister)   COLONOSCOPY WITH PROPOFOL N/A 04/02/2015   Procedure: COLONOSCOPY WITH PROPOFOL;  Surgeon: Manya Silvas, MD;  Location: [facility];  Service: Endoscopy;  Laterality: N/A;   ESOPHAGOGASTRODUODENOSCOPY  02/05/2004   HERNIA REPAIR     JOINT REPLACEMENT Left 2006   Shoulder replacement   Pnuemovax  12/2009   Seed implant for treatment of prostate cancer     Performed in [place]      Prior to Admission medications   Medication Sig Start Date End Date Taking? Authorizing Provider  ACETAMINOPHEN 8 HOUR PO Take 650 mg by mouth as needed.   Yes [provider]  celecoxib (CELEBREX) 200 MG capsule Take 200 mg by mouth daily.    Yes [provider]  diphenhydramine-acetaminophen (TYLENOL PM) 25-500  MG TABS tablet Take 2 tablets by mouth at bedtime as needed.   Yes [provider]  docusate sodium (COLACE) 100 MG capsule Take 100 mg by mouth 2 (two) times daily.   Yes [provider]  ketoconazole (NIZORAL) 2 % shampoo Apply 1 application topically 3 (three) times a week. Wash scalp 3 times a week, let sit 5 minutes before rinsing out 11/12/20  Yes Ralene Bathe, MD  Mesalamine 800 MG TBEC Take by mouth.   Yes [provider]  Multiple Vitamins-Minerals (MULTIVITAMIN ADULT) TABS Take by mouth.   Yes [provider]  mupirocin ointment (BACTROBAN) 2 % Apply to wound once daily until healed 10/01/19  Yes Ralene Bathe, MD  Omega-3 Fatty Acids (FISH OIL CONCENTRATE) 1000 MG CAPS Take 1 capsule by mouth daily.   Yes [provider]  omeprazole (PRILOSEC) 20 MG capsule Take 20 mg by mouth daily.   Yes [provider]  tacrolimus (PROTOPIC) 0.1 % ointment Apply topically in the morning and at bedtime. 04/12/20  Yes Ralene Bathe, MD  valsartan (DIOVAN) 320 MG tablet Take 320 mg by mouth daily.   Yes [provider]  Zinc Sulfate (ZINC 15 PO) Take 30 mg by mouth daily.   Yes [provider]  sildenafil (REVATIO) 20 MG tablet Take 20-100 mg by mouth as needed. Patient not taking: Reported on 12/30/2020    [provider]    Allergies as of 11/16/2020 - Review Complete 11/11/2020  Allergen Reaction Noted   Hydrochlorothiazide Rash 04/01/2015   Norvasc [amlodipine] Rash 04/01/2015   Penicillin v potassium Rash 04/01/2015   Sulfa antibiotics Rash 04/01/2015    Family History  Problem Relation Age of Onset   Breast cancer Sister     Social History   Socioeconomic History   Marital status: Single    Spouse name: Not on file   Number of children: Not on file   Years of education: Not on file   Highest education level: Not on file  Occupational History   Occupation: retired  Tobacco Use   Smoking status: Never   Smokeless tobacco: Never  Vaping Use   Vaping Use: Never used  Substance and Sexual Activity   Alcohol use: Yes    Alcohol/week: 1.0 standard drink    Types: 1 Glasses of wine per week   Drug use: No   Sexual activity: Yes  Other Topics Concern   Not on file  Social History Narrative   Not on file   Social Determinants of Health   Financial Resource Strain: Not on file  Food Insecurity: Not on file  Transportation Needs: Not on file  Physical Activity: Not on file  Stress: Not on file  Social Connections: Not on file   Intimate Partner Violence: Not on file    Review of Systems: See HPI, otherwise negative ROS  Physical Exam: BP (!) 191/91   Pulse 60   Temp (!) 97.5 F (36.4 C) (Temporal)   Wt 66.7 kg   SpO2 99%   BMI 21.71 kg/m  General:   Alert,  pleasant and cooperative in NAD Head:  Normocephalic and atraumatic. Lungs:  Clear to auscultation.    Heart:  Regular rate and rhythm.   Impression/Plan: Grant Miranda is here for ophthalmic surgery.  Risks, benefits, limitations, and alternatives regarding ophthalmic surgery have been reviewed with the patient.  Questions have been answered.  All parties agreeable.   Leandrew Koyanagi, MD  01/26/2021, 9:25  AM

## 2021-01-26 NOTE — Anesthesia Preprocedure Evaluation (Signed)
Anesthesia Evaluation  Patient identified by MRN, date of birth, ID band Patient awake    Airway Mallampati: II  TM Distance: >3 FB Neck ROM: full    Dental no notable dental hx.    Pulmonary neg pulmonary ROS,    Pulmonary exam normal        Cardiovascular hypertension, On Medications Normal cardiovascular exam Rhythm:regular Rate:Normal     Neuro/Psych  Neuromuscular disease    GI/Hepatic Neg liver ROS, Medicated,  Endo/Other  negative endocrine ROS  Renal/GU negative Renal ROS  negative genitourinary   Musculoskeletal   Abdominal   Peds  Hematology negative hematology ROS (+)   Anesthesia Other Findings   Reproductive/Obstetrics negative OB ROS                            Anesthesia Physical Anesthesia Plan  ASA: 2  Anesthesia Plan: MAC   Post-op Pain Management:    Induction:   PONV Risk Score and Plan: 1 and TIVA and Midazolam  Airway Management Planned:   Additional Equipment:   Intra-op Plan:   Post-operative Plan:   Informed Consent: I have reviewed the patients History and Physical, chart, labs and discussed the procedure including the risks, benefits and alternatives for the proposed anesthesia with the patient or authorized representative who has indicated his/her understanding and acceptance.       Plan Discussed with:   Anesthesia Plan Comments:         Anesthesia Quick Evaluation

## 2021-01-26 NOTE — Anesthesia Procedure Notes (Signed)
Procedure Name: MAC Date/Time: 01/26/2021 10:25 AM Performed by: Jeannene Patella, CRNA Pre-anesthesia Checklist: Patient identified, Emergency Drugs available, Suction available, Timeout performed and Patient being monitored Patient Re-evaluated:Patient Re-evaluated prior to induction Oxygen Delivery Method: Nasal cannula Placement Confirmation: positive ETCO2

## 2021-01-27 ENCOUNTER — Encounter: Payer: Self-pay | Admitting: Ophthalmology

## 2021-03-23 ENCOUNTER — Encounter: Payer: Self-pay | Admitting: Dermatology

## 2021-03-23 ENCOUNTER — Other Ambulatory Visit: Payer: Self-pay

## 2021-03-23 ENCOUNTER — Ambulatory Visit: Payer: Medicare PPO | Admitting: Dermatology

## 2021-03-23 DIAGNOSIS — D492 Neoplasm of unspecified behavior of bone, soft tissue, and skin: Secondary | ICD-10-CM

## 2021-03-23 DIAGNOSIS — L570 Actinic keratosis: Secondary | ICD-10-CM | POA: Diagnosis not present

## 2021-03-23 DIAGNOSIS — D030 Melanoma in situ of lip: Secondary | ICD-10-CM | POA: Diagnosis not present

## 2021-03-23 DIAGNOSIS — L820 Inflamed seborrheic keratosis: Secondary | ICD-10-CM

## 2021-03-23 DIAGNOSIS — D039 Melanoma in situ, unspecified: Secondary | ICD-10-CM

## 2021-03-23 DIAGNOSIS — L578 Other skin changes due to chronic exposure to nonionizing radiation: Secondary | ICD-10-CM | POA: Diagnosis not present

## 2021-03-23 HISTORY — DX: Melanoma in situ, unspecified: D03.9

## 2021-03-23 NOTE — Patient Instructions (Addendum)

## 2021-03-23 NOTE — Progress Notes (Signed)
Follow-Up Visit   Subjective  Grant Miranda is a 88 y.o. male who presents for the following: Actinic Keratosis (Face, scalp, arms 69m f/u) and recheck Solar Lentigo (Bx proven R oral commissure, bx 11/13/18). The patient has spots, moles and lesions to be evaluated, some may be new or changing and the patient has concerns that these could be cancer.  The following portions of the chart were reviewed this encounter and updated as appropriate:   Tobacco   Allergies   Meds   Problems   Med Hx   Surg Hx   Fam Hx      Review of Systems:  No other skin or systemic complaints except as noted in HPI or Assessment and Plan.  Objective  Well appearing patient in no apparent distress; mood and affect are within normal limits.  A focused examination was performed including face, arms, scalp. Relevant physical exam findings are noted in the Assessment and Plan.  face, bil arms x 18 (18) Pink scaly macules   R oral commissure Brown macule ~1.5 x 1.0cm       face x 2 (2) Stuck on waxy paps with erythema    Assessment & Plan   Actinic Damage - chronic, secondary to cumulative UV radiation exposure/sun exposure over time - diffuse scaly erythematous macules with underlying dyspigmentation - Recommend daily broad spectrum sunscreen SPF 30+ to sun-exposed areas, reapply every 2 hours as needed.  - Recommend staying in the shade or wearing long sleeves, sun glasses (UVA+UVB protection) and wide brim hats (4-inch brim around the entire circumference of the hat). - Call for new or changing lesions.   AK (actinic keratosis) (18) face, bil arms x 18  Destruction of lesion - face, bil arms x 18 Complexity: simple   Destruction method: cryotherapy   Informed consent: discussed and consent obtained   Timeout:  patient name, date of birth, surgical site, and procedure verified Lesion destroyed using liquid nitrogen: Yes   Region frozen until ice ball extended beyond lesion: Yes    Outcome: patient tolerated procedure well with no complications   Post-procedure details: wound care instructions given    Neoplasm of skin R oral commissure  Skin / nail biopsy Type of biopsy: tangential   Informed consent: discussed and consent obtained   Timeout: patient name, date of birth, surgical site, and procedure verified   Procedure prep:  Patient was prepped and draped in usual sterile fashion Prep type:  Isopropyl alcohol Anesthesia: the lesion was anesthetized in a standard fashion   Anesthetic:  1% lidocaine w/ epinephrine 1-100,000 buffered w/ 8.4% NaHCO3 Instrument used: flexible razor blade   Outcome: patient tolerated procedure well   Post-procedure details: sterile dressing applied and wound care instructions given   Dressing type: bandage and bacitracin    Specimen 1 - Surgical pathology Differential Diagnosis: D48.5 Solar Lentigo vs other  Check Margins: No Brown macule ~1.5 x 1.0cm Previous bx : [id number]  Inflamed seborrheic keratosis (2) face x 2  Destruction of lesion - face x 2 Complexity: simple   Destruction method: cryotherapy   Informed consent: discussed and consent obtained   Timeout:  patient name, date of birth, surgical site, and procedure verified Lesion destroyed using liquid nitrogen: Yes   Region frozen until ice ball extended beyond lesion: Yes   Outcome: patient tolerated procedure well with no complications   Post-procedure details: wound care instructions given     Return in about 4 months (around 07/21/2021) for AK  f/u.  I, Othelia Pulling, RMA, am acting as scribe for Sarina Ser, MD . Documentation: I have reviewed the above documentation for accuracy and completeness, and I agree with the above.  Sarina Ser, MD

## 2021-03-24 ENCOUNTER — Encounter: Payer: Self-pay | Admitting: Dermatology

## 2021-03-28 ENCOUNTER — Telehealth: Payer: Self-pay

## 2021-03-28 NOTE — Telephone Encounter (Signed)
-----   Message from Ralene Bathe, MD sent at 03/28/2021 11:20 AM EST ----- Diagnosis Skin , right oral commissure MELANOMA IN SITU, LENTIGO MALIGNA TYPE, PERIPHERAL MARGIN INVOLVED  Cancer - Melanoma in situ Superficial Schedule appt to discuss treatment options

## 2021-03-28 NOTE — Telephone Encounter (Signed)
Tiffany from [facility] calling with pathology results, right oral commissure Melanoma in situ Lentigo Maligna type  Results will be faxed here today

## 2021-03-28 NOTE — Telephone Encounter (Signed)
Called patient and scheduled appointment to discuss biopsy results 03/29/2021/hd

## 2021-03-29 ENCOUNTER — Encounter: Payer: Self-pay | Admitting: Dermatology

## 2021-03-29 ENCOUNTER — Ambulatory Visit: Payer: Medicare PPO | Admitting: Dermatology

## 2021-03-29 ENCOUNTER — Other Ambulatory Visit: Payer: Self-pay

## 2021-03-29 DIAGNOSIS — D030 Melanoma in situ of lip: Secondary | ICD-10-CM | POA: Diagnosis not present

## 2021-03-29 DIAGNOSIS — D0330 Melanoma in situ of unspecified part of face: Secondary | ICD-10-CM

## 2021-03-29 MED ORDER — MUPIROCIN 2 % EX OINT: 1.0000 "application " | TOPICAL_OINTMENT | Freq: Every day | CUTANEOUS | 1 refills | Status: AC

## 2021-03-29 NOTE — Progress Notes (Signed)
Follow-Up Visit   Subjective  Grant Miranda is a 88 y.o. male who presents for the following: MELANOMA IN SITU, LENTIGO MALIGNA TYPE (R oral commissure, bx proven, pt presents to discuss bx and treatment).  The following portions of the chart were reviewed this encounter and updated as appropriate:   Tobacco   Allergies   Meds   Problems   Med Hx   Surg Hx   Fam Hx      Review of Systems:  No other skin or systemic complaints except as noted in HPI or Assessment and Plan.  Objective  Well appearing patient in no apparent distress; mood and affect are within normal limits.  A focused examination was performed including face. Relevant physical exam findings are noted in the Assessment and Plan.  R oral commissure x 1 Brown macule with pink bx site   Assessment & Plan   MELANOMA IN SITU, LENTIGO MALIGNA TYPE, bx proven  Discussed treatment options LN2 with topical imiquimod cream vs MOHs surgery.  Because of the low biologic potential and low aggressiveness of this lentigo maligna type of melanoma in situ and factoring in the patient's age of 88 year old and the fact that this would be a significant functional and cosmetic alteration in this area of his right oral commissure if he had surgery, we discussed the option of liquid nitrogen with topical imiquimod treatment.  The patient prefers not to have surgery and wants to pursue the less aggressive route.  He is advised if there is evidence of persistence or recurrence after treatment regimen, we may have to go to surgery.  He understands.  LN2 x 1 today Plan starting Imiquimod on f/u  Discussed diagnosis in detail including significance of melanoma diagnosis which can be potentially lethal.  Discussed treatment recommendations in detail advising that treatment recommendations are based on longitudinal studies and retrospective studies and are nationwide protocols.  Advised there is always potential for recurrence even after  definitive treatment.  After definitive treatment, we recommend total-body skin exams every 3 months for a year; then every 4 months for a year; then every 6 months for 3 years.  At 5 years post treatment, if all looks good we would recommend at least yearly total-body skin exams for the rest of your life.  The patient was given time for questions and these were answered.  We recommend frequent self skin examinations; photoprotection with sunscreen, sun protective clothing, hats, sunglasses and sun avoidance.  If the patient notices any new or changing skin lesions the patient should return to the office immediately for evaluation.    Melanoma in situ of face ([place]) R oral commissure x 1  Destruction of lesion Complexity: simple   Destruction method: cryotherapy   Informed consent: discussed and consent obtained   Timeout:  patient name, date of birth, surgical site, and procedure verified Lesion destroyed using liquid nitrogen: Yes   Region frozen until ice ball extended beyond lesion: Yes   Lesion length (cm):  1.5 Lesion width (cm):  1.5 Margin per side (cm):  0.2 Final wound size (cm):  1.9 Outcome: patient tolerated procedure well with no complications   Post-procedure details: wound care instructions given   Additional details:  Post treatment defect 1.9cm  mupirocin ointment (BACTROBAN) 2 % Apply 1 application topically daily. Qd to wound on face  Return in about 5 weeks (around 05/03/2021) for Melanoma IS Lentigo maligna type f/u.  I, Grant Miranda, RMA, am acting as scribe for [employer]  Grant Massed, MD . Documentation: I have reviewed the above documentation for accuracy and completeness, and I agree with the above.  Grant Ser, MD

## 2021-03-29 NOTE — Patient Instructions (Addendum)
If You Need Anything After Your Visit  If you have any questions or concerns for your doctor, please call our main line at [phone] and press option 4 to reach your doctor's medical assistant. If no one answers, please leave a voicemail as directed and we will return your call as soon as possible. Messages left after 4 pm will be answered the following business day.   You may also send us a message via MyChart. We typically respond to MyChart messages within 1-2 business days.  For prescription refills, please ask your pharmacy to contact our office. Our fax number is [phone].  If you have an urgent issue when the clinic is closed that cannot wait until the next business day, you can page your doctor at the number below.    Please note that while we do our best to be available for urgent issues outside of office hours, we are not available 24/7.   If you have an urgent issue and are unable to reach us, you may choose to seek medical care at your doctor's office, retail clinic, urgent care center, or emergency room.  If you have a medical emergency, please immediately call 911 or go to the emergency department.  Pager Numbers  - Dr. Kowalski: [phone]  - Dr. Moye: [phone]  - Dr. Stewart: [phone]  In the event of inclement weather, please call our main line at [phone] for an update on the status of any delays or closures.  Dermatology Medication Tips: Please keep the boxes that topical medications come in in order to help keep track of the instructions about where and how to use these. Pharmacies typically print the medication instructions only on the boxes and not directly on the medication tubes.   If your medication is too expensive, please contact our office at [phone] option 4 or send us a message through MyChart.   We are unable to tell what your co-pay for medications will be in advance as this is different depending on your insurance coverage.  However, we may be able to find a substitute medication at lower cost or fill out paperwork to get insurance to cover a needed medication.   If a prior authorization is required to get your medication covered by your insurance company, please allow us 1-2 business days to complete this process.  Drug prices often vary depending on where the prescription is filled and some pharmacies may offer cheaper prices.  The website www.goodrx.com contains coupons for medications through different pharmacies. The prices here do not account for what the cost may be with help from insurance (it may be cheaper with your insurance), but the website can give you the price if you did not use any insurance.  - You can print the associated coupon and take it with your prescription to the pharmacy.  - You may also stop by our office during regular business hours and pick up a GoodRx coupon card.  - If you need your prescription sent electronically to a different pharmacy, notify our office through [facility] MyChart or by phone at [phone] option 4.     Si Usted Necesita Algo Despus de Su Visita  Tambin puede enviarnos un mensaje a travs de MyChart. Por lo general respondemos a los mensajes de MyChart en el transcurso de 1 a 2 das hbiles.  Para renovar recetas, por favor pida a su farmacia que se ponga en contacto con nuestra oficina. Nuestro nmero de fax es el [phone].  Si tiene   un asunto urgente cuando la clnica est cerrada y que no puede esperar hasta el siguiente da hbil, puede llamar/localizar a su doctor(a) al nmero que aparece a continuacin.   Por favor, tenga en cuenta que aunque hacemos todo lo posible para estar disponibles para asuntos urgentes fuera del horario de oficina, no estamos disponibles las 24 horas del da, los 7 das de la semana.   Si tiene un problema urgente y no puede comunicarse con nosotros, puede optar por buscar atencin mdica  en el consultorio de su  doctor(a), en una clnica privada, en un centro de atencin urgente o en una sala de emergencias.  Si tiene una emergencia mdica, por favor llame inmediatamente al 911 o vaya a la sala de emergencias.  Nmeros de bper  - Dr. Kowalski: [phone]  - Dra. Moye: [phone]  - Dra. Stewart: [phone]  En caso de inclemencias del tiempo, por favor llame a nuestra lnea principal al [phone] para una actualizacin sobre el estado de cualquier retraso o cierre.  Consejos para la medicacin en dermatologa: Por favor, guarde las cajas en las que vienen los medicamentos de uso tpico para ayudarle a seguir las instrucciones sobre dnde y cmo usarlos. Las farmacias generalmente imprimen las instrucciones del medicamento slo en las cajas y no directamente en los tubos del medicamento.   Si su medicamento es muy caro, por favor, pngase en contacto con nuestra oficina llamando al [phone] y presione la opcin 4 o envenos un mensaje a travs de MyChart.   No podemos decirle cul ser su copago por los medicamentos por adelantado ya que esto es diferente dependiendo de la cobertura de su seguro. Sin embargo, es posible que podamos encontrar un medicamento sustituto a menor costo o llenar un formulario para que el seguro cubra el medicamento que se considera necesario.   Si se requiere una autorizacin previa para que su compaa de seguros cubra su medicamento, por favor permtanos de 1 a 2 das hbiles para completar este proceso.  Los precios de los medicamentos varan con frecuencia dependiendo del lugar de dnde se surte la receta y alguna farmacias pueden ofrecer precios ms baratos.  El sitio web www.goodrx.com tiene cupones para medicamentos de diferentes farmacias. Los precios aqu no tienen en cuenta lo que podra costar con la ayuda del seguro (puede ser ms barato con su seguro), pero el sitio web puede darle el precio si no utiliz ningn seguro.  - Puede imprimir el cupn  correspondiente y llevarlo con su receta a la farmacia.  - Tambin puede pasar por nuestra oficina durante el horario de atencin regular y recoger una tarjeta de cupones de GoodRx.  - Si necesita que su receta se enve electrnicamente a una farmacia diferente, informe a nuestra oficina a travs de MyChart de [place] o por telfono llamando al [phone] y presione la opcin 4.   Cryotherapy Aftercare  Wash gently with soap and water everyday.   Apply Vaseline and Band-Aid daily until healed.  

## 2021-03-31 ENCOUNTER — Encounter: Payer: Self-pay | Admitting: Dermatology

## 2021-05-02 ENCOUNTER — Other Ambulatory Visit: Payer: Self-pay

## 2021-05-02 ENCOUNTER — Encounter: Payer: Self-pay | Admitting: Dermatology

## 2021-05-02 ENCOUNTER — Ambulatory Visit: Payer: Medicare PPO | Admitting: Dermatology

## 2021-05-02 DIAGNOSIS — D0339 Melanoma in situ of other parts of face: Secondary | ICD-10-CM | POA: Diagnosis not present

## 2021-05-02 DIAGNOSIS — L570 Actinic keratosis: Secondary | ICD-10-CM

## 2021-05-02 DIAGNOSIS — D0330 Melanoma in situ of unspecified part of face: Secondary | ICD-10-CM

## 2021-05-02 DIAGNOSIS — L820 Inflamed seborrheic keratosis: Secondary | ICD-10-CM

## 2021-05-02 MED ORDER — IMIQUIMOD 5 % EX CREA: TOPICAL_CREAM | CUTANEOUS | 1 refills | Status: DC

## 2021-05-02 NOTE — Progress Notes (Signed)
Follow-Up Visit   Subjective  Grant Miranda is a 88 y.o. male who presents for the following: Melanoma (MIS at right oral commissure. Tx with LN2 5 weeks ago. Here for follow up  and treatment with Imiquimod) and lesion (Behind left ear. Tender. Rubbed by glasses). The patient has spots, moles and lesions to be evaluated, some may be new or changing and the patient has concerns that these could be cancer.  The following portions of the chart were reviewed this encounter and updated as appropriate:  Tobacco   Allergies   Meds   Problems   Med Hx   Surg Hx   Fam Hx      Review of Systems: No other skin or systemic complaints except as noted in HPI or Assessment and Plan.  Objective  Well appearing patient in no apparent distress; mood and affect are within normal limits.  A focused examination was performed including face, neck. Relevant physical exam findings are noted in the Assessment and Plan.  Right Oral Commissure Pink patch  Left Postauricular scalp x1 Erythematous keratotic or waxy stuck-on papule or plaque.  Left Posterior Neck x1 Erythematous thin papules/macules with gritty scale.    Assessment & Plan  Melanoma in situ of face ([place]) Lentigo Maligna Type; Bx proven Right Oral Commissure imiquimod (ALDARA) 5 % cream Apply every night at bedtime  Reviewed expected reaction when using imiquimod cream, including irritation and mild inflammation and risk of erosions or more severe inflammation. Reviewed not to apply this in an area larger than 4 x 4 inches to avoid flu-like symptoms. Only a thin layer is required. Reviewed if too much irritation occurs, ensure application of only a thin layer and decrease frequency slightly to achieve a tolerable level of inflammation.   Inflamed seborrheic keratosis Left Postauricular scalp x1 Destruction of lesion - Left Postauricular scalp x1 Complexity: simple   Destruction method: cryotherapy   Informed consent: discussed and  consent obtained   Timeout:  patient name, date of birth, surgical site, and procedure verified Lesion destroyed using liquid nitrogen: Yes   Region frozen until ice ball extended beyond lesion: Yes   Outcome: patient tolerated procedure well with no complications   Post-procedure details: wound care instructions given    AK (actinic keratosis) Left Posterior Neck x1 Actinic keratoses are precancerous spots that appear secondary to cumulative UV radiation exposure/sun exposure over time. They are chronic with expected duration over 1 year. A portion of actinic keratoses will progress to squamous cell carcinoma of the skin. It is not possible to reliably predict which spots will progress to skin cancer and so treatment is recommended to prevent development of skin cancer.  Recommend daily broad spectrum sunscreen SPF 30+ to sun-exposed areas, reapply every 2 hours as needed.  Recommend staying in the shade or wearing long sleeves, sun glasses (UVA+UVB protection) and wide brim hats (4-inch brim around the entire circumference of the hat). Call for new or changing lesions.  Destruction of lesion - Left Posterior Neck x1 Complexity: simple   Destruction method: cryotherapy   Informed consent: discussed and consent obtained   Timeout:  patient name, date of birth, surgical site, and procedure verified Lesion destroyed using liquid nitrogen: Yes   Region frozen until ice ball extended beyond lesion: Yes   Outcome: patient tolerated procedure well with no complications   Post-procedure details: wound care instructions given    Return in about 1 month (around 05/30/2021) for MIS Follow Up.  I, Sharee Pimple  Parcell, CMA, am acting as scribe for Sarina Ser, MD. Documentation: I have reviewed the above documentation for accuracy and completeness, and I agree with the above.  Sarina Ser, MD

## 2021-05-02 NOTE — Patient Instructions (Addendum)
Reviewed expected reaction when using imiquimod cream, including irritation and mild inflammation and risk of erosions or more severe inflammation. Reviewed not to apply this in an area larger than 4 x 4 inches to avoid flu-like symptoms. Only a thin layer is required. Reviewed if too much irritation occurs, ensure application of only a thin layer and decrease frequency slightly to achieve a tolerable level of inflammation.   Cryotherapy Aftercare  Wash gently with soap and water everyday.   Apply Vaseline and Band-Aid daily until healed.   Prior to procedure, discussed risks of blister formation, small wound, skin dyspigmentation, or rare scar following cryotherapy. Recommend Vaseline ointment to treated areas while healing.    If You Need Anything After Your Visit  If you have any questions or concerns for your doctor, please call our main line at [phone] and press option 4 to reach your doctor's medical assistant. If no one answers, please leave a voicemail as directed and we will return your call as soon as possible. Messages left after 4 pm will be answered the following business day.   You may also send [place] a message via [place]. We typically respond to MyChart messages within 1-2 business days.  For prescription refills, please ask your pharmacy to contact our office. Our fax number is [phone].  If you have an urgent issue when the clinic is closed that cannot wait until the next business day, you can page your doctor at the number below.    Please note that while we do our best to be available for urgent issues outside of office hours, we are not available 24/7.   If you have an urgent issue and are unable to reach [place], you may choose to seek medical care at your doctor's office, retail clinic, urgent care center, or emergency room.  If you have a medical emergency, please immediately call 911 or go to the emergency department.  Pager Numbers  - Dr. Nehemiah Massed:  [phone]  - Dr. Laurence Ferrari: [phone]  - Dr. Nicole Kindred: [phone]  In the event of inclement weather, please call our main line at [phone] for an update on the status of any delays or closures.  Dermatology Medication Tips: Please keep the boxes that topical medications come in in order to help keep track of the instructions about where and how to use these. Pharmacies typically print the medication instructions only on the boxes and not directly on the medication tubes.   If your medication is too expensive, please contact our office at [phone] option 4 or send [place] a message through [place].   We are unable to tell what your co-pay for medications will be in advance as this is different depending on your insurance coverage. However, we may be able to find a substitute medication at lower cost or fill out paperwork to get insurance to cover a needed medication.   If a prior authorization is required to get your medication covered by your insurance company, please allow [place] 1-2 business days to complete this process.  Drug prices often vary depending on where the prescription is filled and some pharmacies may offer cheaper prices.  The website www.goodrx.com contains coupons for medications through different pharmacies. The prices here do not account for what the cost may be with help from insurance (it may be cheaper with your insurance), but the website can give you the price if you did not use any insurance.  - You can print the associated coupon and take it with your  prescription to the pharmacy.  - You may also stop by our office during regular business hours and pick up a GoodRx coupon card.  - If you need your prescription sent electronically to a different pharmacy, notify our office through [facility] or by phone at [phone] option 4.     Si Usted Necesita Algo Despus de Su Visita  Tambin puede enviarnos un mensaje a travs de Pharmacist, community. Por lo general  respondemos a los mensajes de MyChart en el transcurso de 1 a 2 das hbiles.  Para renovar recetas, por favor pida a su farmacia que se ponga en contacto con nuestra oficina. Harland Dingwall de fax es [place] [phone].  Si tiene un asunto urgente cuando la clnica est cerrada y que no puede esperar hasta el siguiente da hbil, puede llamar/localizar a su doctor(a) al nmero que aparece a continuacin.   Por favor, tenga en cuenta que aunque hacemos todo lo posible para estar disponibles para asuntos urgentes fuera del horario de [place], no estamos disponibles las 24 horas del da, los 7 das de la [place].   Si tiene un problema urgente y no puede comunicarse con nosotros, puede optar por buscar atencin mdica  en el consultorio de su doctor(a), en una clnica privada, en un centro de atencin urgente o en una sala de emergencias.  Si tiene Engineering geologist, por favor llame inmediatamente al 911 o vaya a la sala de emergencias.  Nmeros de bper  - Dr. Nehemiah Massed: [phone]  - Dra. Moye: [phone]  - Dra. Nicole Kindred: [phone]  En caso de inclemencias del [place], por favor llame a Johnsie Kindred principal al [phone] para una actualizacin sobre el [place] de cualquier retraso o cierre.  Consejos para la medicacin en dermatologa: Por favor, guarde las cajas en las que vienen los medicamentos de uso tpico para ayudarle a seguir las instrucciones sobre dnde y cmo usarlos. Las farmacias generalmente imprimen las instrucciones del medicamento slo en las cajas y no directamente en los tubos del [place].   Si su medicamento es muy caro, por favor, pngase en contacto con Zigmund Daniel llamando al [phone] y presione la opcin 4 o envenos un mensaje a travs de Pharmacist, community.   No podemos decirle cul ser su copago por los medicamentos por adelantado ya que esto es diferente dependiendo de la cobertura de su seguro. Sin embargo, es posible que podamos encontrar un  medicamento sustituto a Electrical engineer un formulario para que el seguro cubra el medicamento que se considera necesario.   Si se requiere una autorizacin previa para que su compaa de seguros [place] su medicamento, por favor permtanos de 1 a 2 das hbiles para completar este proceso.  Los precios de los medicamentos varan con frecuencia dependiendo del Environmental consultant de dnde se surte la receta y alguna farmacias pueden ofrecer precios ms baratos.  El sitio web www.goodrx.com tiene cupones para medicamentos de Airline pilot. Los precios aqu no tienen en cuenta lo que podra costar con la ayuda del seguro (puede ser ms barato con su seguro), pero el sitio web puede darle el precio si no utiliz Research scientist (physical sciences).  - Puede imprimir el cupn correspondiente y llevarlo con su receta a la farmacia.  - Tambin puede pasar por nuestra oficina durante el horario de atencin regular y Charity fundraiser una tarjeta de cupones de GoodRx.  - Si necesita que su receta se enve electrnicamente a Chiropodist, informe a nuestra oficina a travs de [place] o  por telfono llamando al [phone] y presione la opcin 4.

## 2021-05-06 ENCOUNTER — Encounter: Payer: Self-pay | Admitting: Dermatology

## 2021-05-30 ENCOUNTER — Ambulatory Visit: Payer: Medicare PPO | Admitting: Dermatology

## 2021-06-01 ENCOUNTER — Other Ambulatory Visit: Payer: Self-pay

## 2021-06-01 ENCOUNTER — Ambulatory Visit: Payer: Medicare PPO | Admitting: Dermatology

## 2021-06-01 DIAGNOSIS — D0339 Melanoma in situ of other parts of face: Secondary | ICD-10-CM

## 2021-06-01 DIAGNOSIS — D0330 Melanoma in situ of unspecified part of face: Secondary | ICD-10-CM

## 2021-06-01 NOTE — Patient Instructions (Addendum)
?Stay off Imiquimod for 5 more week, on 07/05/21 restart Imiquimod qhs thin coat and return to clinic 07/12/21 at 4:30. ? ? ? ?If You Need Anything After Your Visit ? ?If you have any questions or concerns for your doctor, please call our main line at [phone] and press option 4 to reach your doctor's medical assistant. If no one answers, please leave a voicemail as directed and we will return your call as soon as possible. Messages left after 4 pm will be answered the following business day.  ? ?You may also send [place] a message via MyChart. We typically respond to MyChart messages within 1-2 business days. ? ?For prescription refills, please ask your pharmacy to contact our office. Our fax number is [phone]. ? ?If you have an urgent issue when the clinic is closed that cannot wait until the next business day, you can page your doctor at the number below.   ? ?Please note that while we do our best to be available for urgent issues outside of office hours, we are not available 24/7.  ? ?If you have an urgent issue and are unable to reach [place], you may choose to seek medical care at your doctor's office, retail clinic, urgent care center, or emergency room. ? ?If you have a medical emergency, please immediately call 911 or go to the emergency department. ? ?Pager Numbers ? ?- Dr. Nehemiah Massed: [phone] ? ?- Dr. Laurence Ferrari: [phone] ? ?- Dr. Nicole Kindred: [phone] ? ?In the event of inclement weather, please call our main line at [phone] for an update on the status of any delays or closures. ? ?Dermatology Medication Tips: ?Please keep the boxes that topical medications come in in order to help keep track of the instructions about where and how to use these. Pharmacies typically print the medication instructions only on the boxes and not directly on the medication tubes.  ? ?If your medication is too expensive, please contact our office at [phone] option 4 or send [place] a message through [place].  ? ?We are  unable to tell what your co-pay for medications will be in advance as this is different depending on your insurance coverage. However, we may be able to find a substitute medication at lower cost or fill out paperwork to get insurance to cover a needed medication.  ? ?If a prior authorization is required to get your medication covered by your insurance company, please allow [place] 1-2 business days to complete this process. ? ?Drug prices often vary depending on where the prescription is filled and some pharmacies may offer cheaper prices. ? ?The website www.goodrx.com contains coupons for medications through different pharmacies. The prices here do not account for what the cost may be with help from insurance (it may be cheaper with your insurance), but the website can give you the price if you did not use any insurance.  ?- You can print the associated coupon and take it with your prescription to the pharmacy.  ?- You may also stop by our office during regular business hours and pick up a GoodRx coupon card.  ?- If you need your prescription sent electronically to a different pharmacy, notify our office through [facility] or by phone at [phone] option 4. ? ? ? ? ?Si Usted Necesita Algo Despu?s de Su Visita ? ?Tambi?n puede enviarnos un mensaje a trav?s de MyChart. Por lo general respondemos a los mensajes de MyChart en el transcurso de 1 a 2 d?as h?biles. ? ?Para renovar recetas,  por favor pida a su farmacia que se ponga en contacto con nuestra oficina. Nuestro n?mero de fax es el [phone]. ? ?Si tiene un asunto urgente cuando la cl?nica est? cerrada y que no puede esperar hasta el siguiente d?a h?bil, puede llamar/localizar a su doctor(a) al n?mero que aparece a continuaci?n.  ? ?Por favor, tenga en cuenta que aunque hacemos todo lo posible para estar disponibles para asuntos urgentes fuera del horario de oficina, no estamos disponibles las 24 horas del d?a, los 7 d?as de la semana.  ? ?Si tiene un  problema urgente y no puede comunicarse con nosotros, puede optar por buscar atenci?n m?dica  en el consultorio de su doctor(a), en una cl?nica privada, en un centro de atenci?n urgente o en una sala de emergencias. ? ?Si tiene Engineer, maintenance (IT) m?dica, por favor llame inmediatamente al 911 o vaya a la sala de emergencias. ? ?N?meros de b?per ? ?- Dr. Nehemiah Massed: [phone] ? ?- Dra. Moye: [phone] ? ?- Dra. Nicole Kindred: [phone] ? ?En caso de inclemencias del tiempo, por favor llame a nuestra l?nea principal al [phone] para una actualizaci?n sobre el estado de cualquier retraso o cierre. ? ?Consejos para la medicaci?n en dermatolog?a: ?Por favor, guarde las cajas en las que vienen los medicamentos de uso t?pico para ayudarle a seguir las instrucciones sobre d?nde y c?mo usarlos. Las farmacias generalmente imprimen las instrucciones del medicamento s?lo en las cajas y no directamente en los tubos del [place].  ? ?Si su medicamento es muy caro, por favor, p?ngase en contacto con Zigmund Daniel llamando al [phone] y presione la opci?n 4 o env?enos un mensaje a trav?s de MyChart.  ? ?No podemos decirle cu?l ser? su copago por los medicamentos por adelantado ya que esto es diferente dependiendo de la cobertura de su seguro. Sin embargo, es posible que podamos encontrar un medicamento sustituto a Electrical engineer un formulario para que el seguro cubra el medicamento que se considera necesario.  ? ?Si se requiere Ardelia Mems autorizaci?n previa para que su compa??a de seguros [place] su medicamento, por favor perm?tanos de 1 a 2 d?as h?biles para completar este proceso. ? ?Los precios de los medicamentos var?an con frecuencia dependiendo del Environmental consultant de d?nde se surte la receta y alguna farmacias pueden ofrecer precios m?s baratos. ? ?El sitio web www.goodrx.com tiene cupones para medicamentos de Airline pilot. Los precios aqu? no tienen en cuenta lo que podr?a costar con la ayuda del seguro (puede ser m?s  barato con su seguro), pero el sitio web puede darle el precio si no utiliz? ning?n seguro.  ?- Puede imprimir el cup?n correspondiente y llevarlo con su receta a la farmacia.  ?- Tambi?n puede pasar por nuestra oficina durante el horario de atenci?n regular y recoger una tarjeta de cupones de GoodRx.  ?- Si necesita que su receta se env?e electr?nicamente a Chiropodist, informe a nuestra oficina a trav?s de MyChart de [place] o por tel?fono llamando al [phone] y presione la opci?n 4.  ?

## 2021-06-01 NOTE — Progress Notes (Signed)
? ?  Follow-Up Visit ?  ?Subjective  ?Grant Miranda is a 88 y.o. 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 ? ?The following portions of the chart were reviewed this encounter and updated as appropriate:  ? Tobacco  Allergies  Meds  Problems  Med Hx  Surg Hx  Fam Hx   ?  ?Review of Systems:  No other skin or systemic complaints except as noted in HPI or Assessment and Plan. ? ?Objective  ?Well appearing patient in no apparent distress; mood and affect are within normal limits. ? ?A focused examination was performed including face. Relevant physical exam findings are noted in the Assessment and Plan. ? ?R oral commissure ?Mild erythema and peeling R oral commissure ? ? ?Assessment & Plan  ?Melanoma in situ of face ([facility] ?Lentigo Maligna type, bx proven 03/23/2021 ?R oral commissure ?Status post liquid nitrogen destruction on March 29, 2021 followed by topical chemotherapy with imiquimod with significant reaction. ?(Patient declined aggressive treatment with surgical intervention due to his age) ? ?Stay off Imiquimod for 5 more weeks, then on 07/05/21 restart Imiquimod qhs thin coat and return to clinic 07/12/21 at 4:30. ? ?Related Medications ?imiquimod (ALDARA) 5 % cream ?Apply every night at bedtime ? ?Return for f/u 07/12/21 at [place] ? ?I, SOthelia Pulling RMA, am acting as scribe for DSarina Ser MD . ?Documentation: I have reviewed the above documentation for accuracy and completeness, and I agree with the above. ? ?DSarina Ser MD ? ?

## 2021-06-07 ENCOUNTER — Encounter: Payer: Self-pay | Admitting: Dermatology

## 2021-07-10 IMAGING — MR MR HIP*L* W/O CM
5 series · 31 of 40 positions shown · non-contrast
Comparison: None.

CLINICAL DATA: Progressive left hip pain for 6 weeks.

EXAM:
MR OF THE LEFT HIP WITHOUT CONTRAST
TECHNIQUE: Multiplanar, multisequence MR imaging was performed. No intravenous
contrast was administered.

[Series 2: T1 · coronal · left · 4.0mm · 0.47mm/px · 2 of 38 slices shown]
[im 1/38]
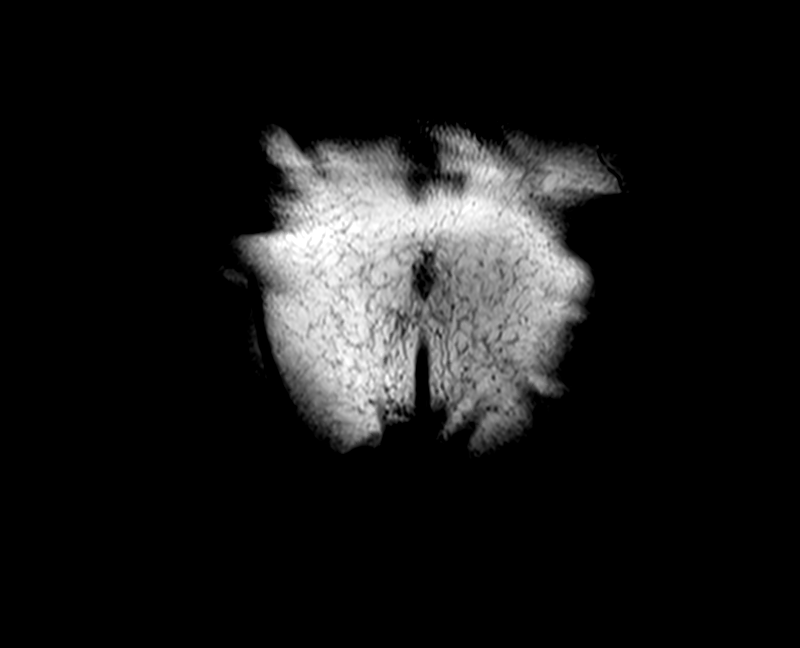
[im 5/38]
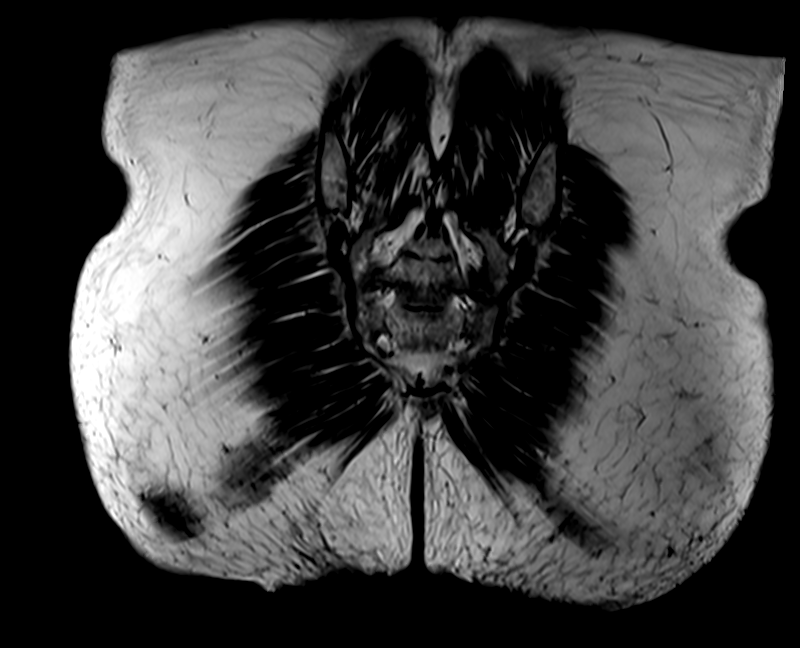

[Series 3: T2 fat-sat · coronal · left · 4.0mm · 1.19mm/px · 8 of 38 slices shown (1 of 2)]
[im 1/38]
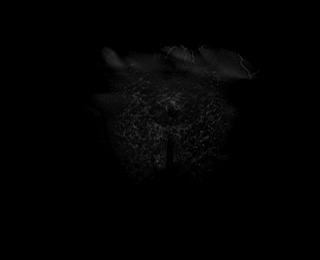
[im 5/38]
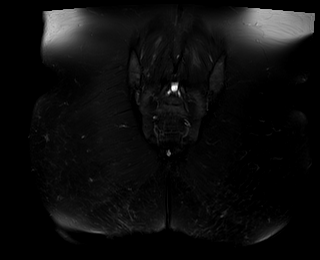
[im 13/38]
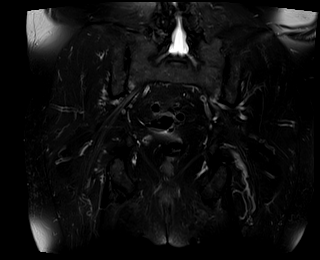
[im 17/38]
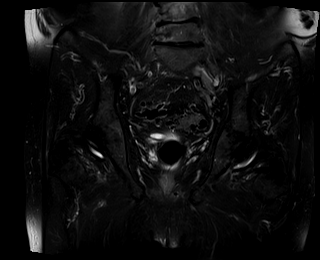
[im 21/38]
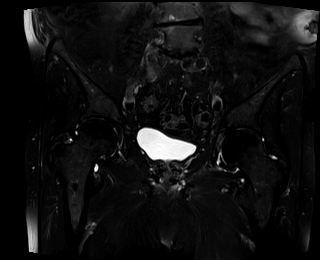
[im 25/38]
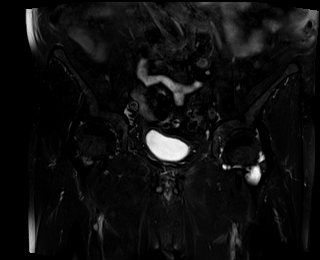
[im 33/38]
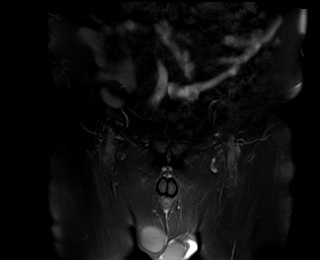
[im 38/38]
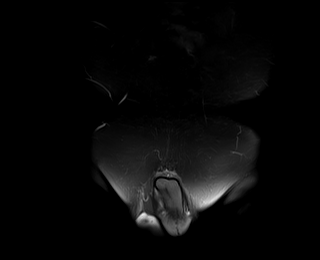

[Series 4: T2 fat-sat · axial · left · 4.0mm · 0.35mm/px · z∈[-109,+36]mm · 8 of 30 slices shown (2 of 2)]
[im 1/30]
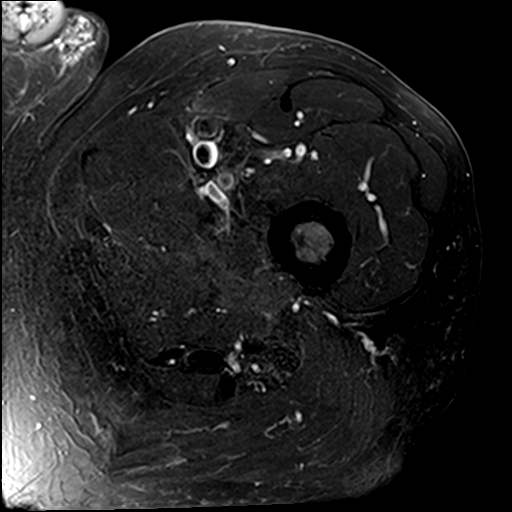
[im 5/30]
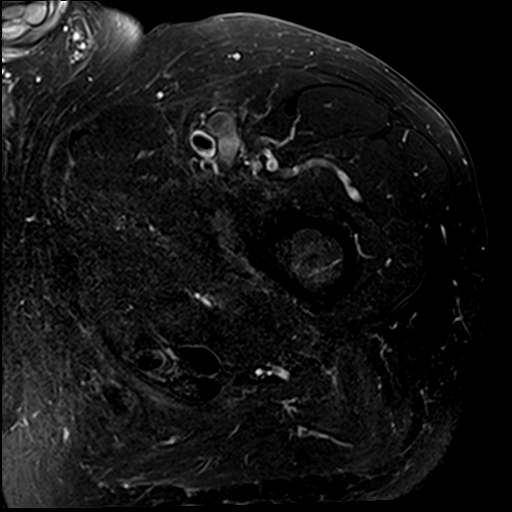
[im 9/30]
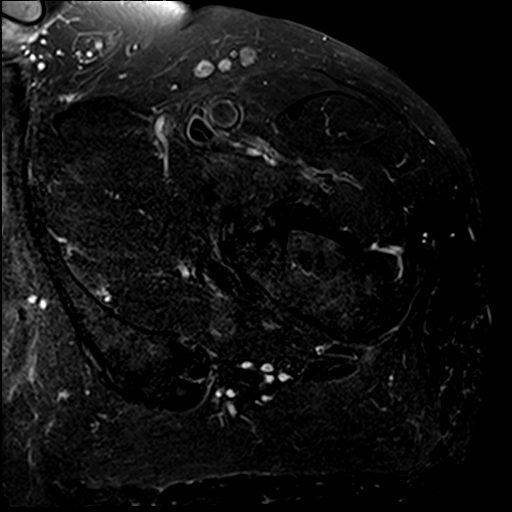
[im 13/30]
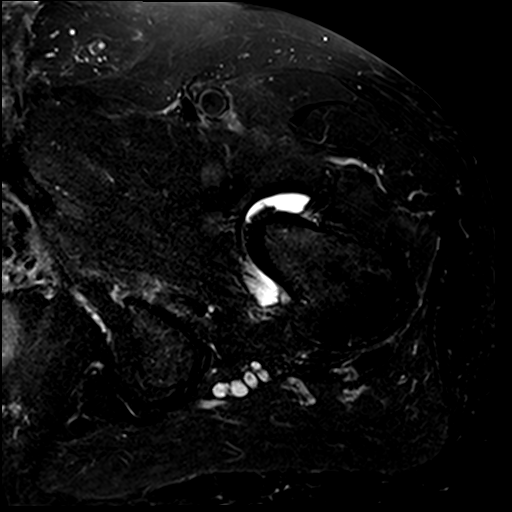
[im 17/30]
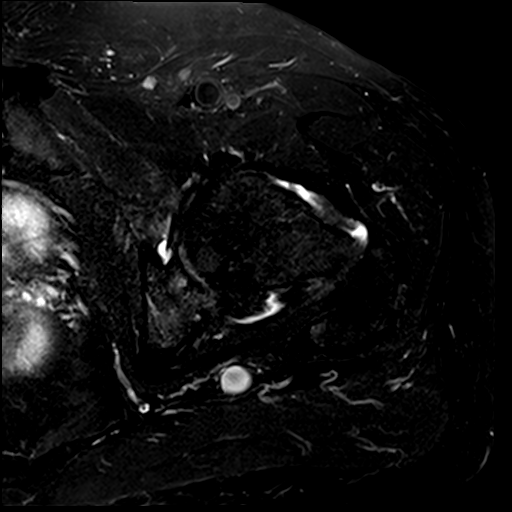
[im 21/30]
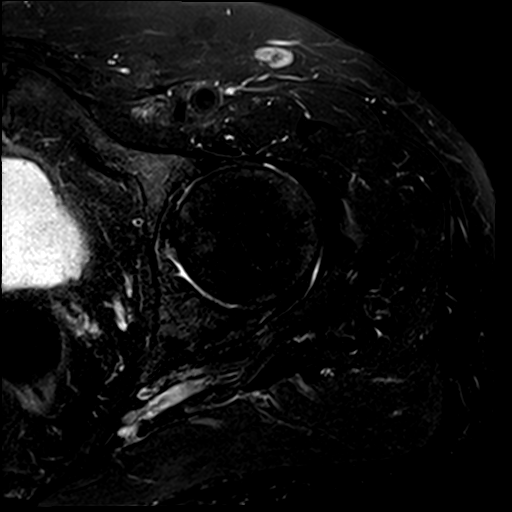
[im 25/30]
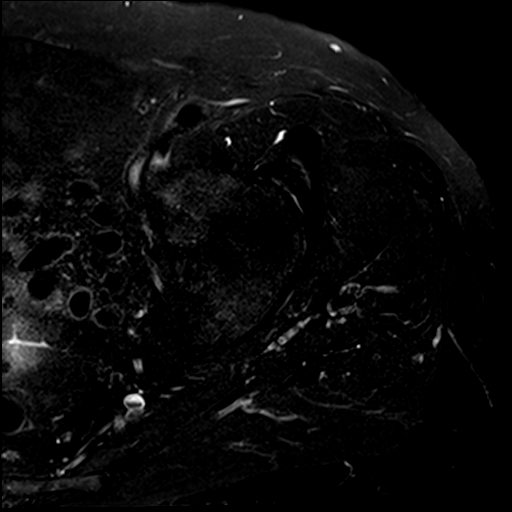
[im 30/30]
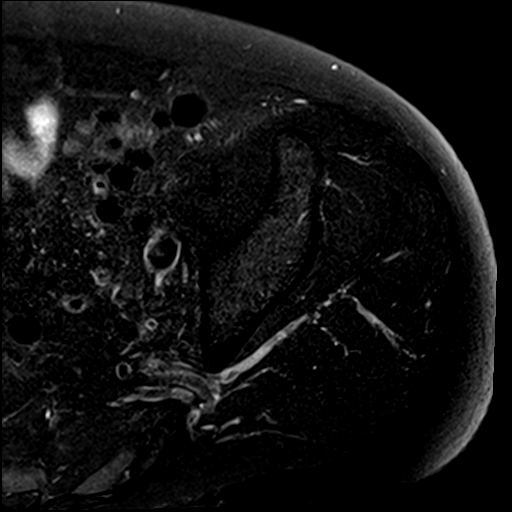

[Series 5: PD fat-sat · sagittal · left · 4.0mm · 0.70mm/px · 7 of 29 slices shown (1 of 2)]
[im 1/29]
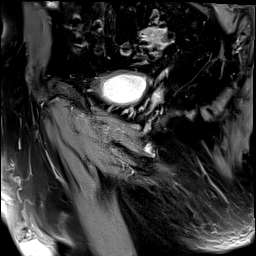
[im 5/29]
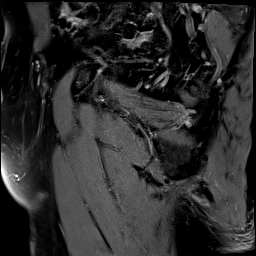
[im 10/29]
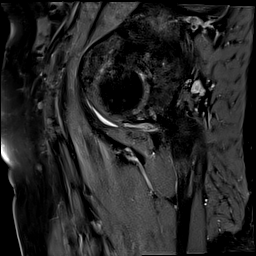
[im 15/29]
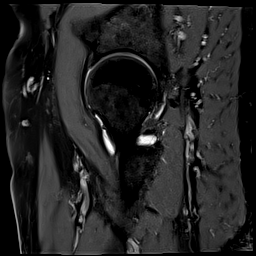
[im 19/29]
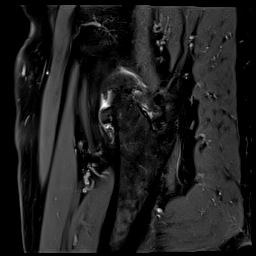
[im 24/29]
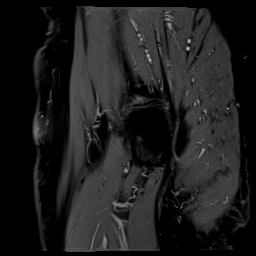
[im 29/29]
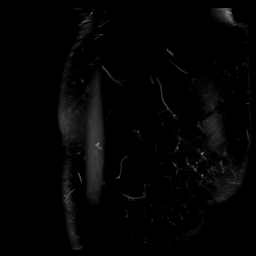

[Series 6: PD fat-sat · coronal · left · 4.0mm · 0.70mm/px · 6 of 24 slices shown (2 of 2)]
[im 1/24]
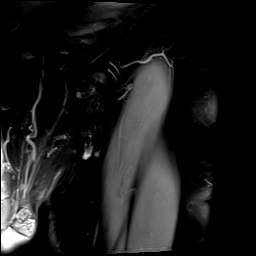
[im 5/24]
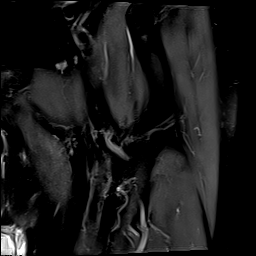
[im 10/24]
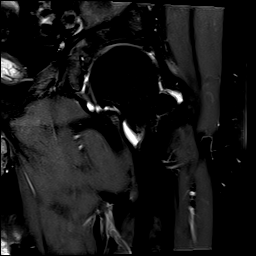
[im 14/24]
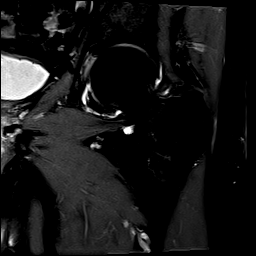
[im 19/24]
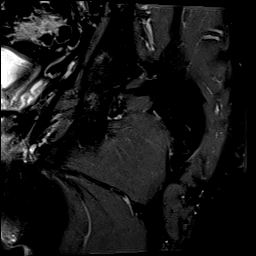
[im 24/24]
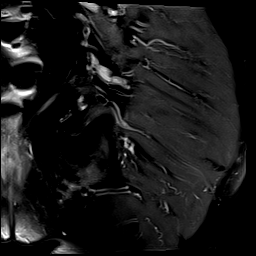

[31 of 40 positions shown; findings below may reference images not displayed]

FINDINGS: Bones: There are slight arthritic changes of both hips with a small
anterolateral exostosis on the left femoral neck. Bones of the
pelvis appear normal as do the sacroiliac joints. There is moderate
facet arthritis at L4-5 and L5-S1 on the right.

Articular cartilage and labrum

Articular cartilage:  No discrete cartilage defects in the left hip.

Labrum:  Labrum is intact.

Joint or bursal effusion

Joint effusion: Trace joint effusion, minimally more fluid than in
the opposite hip.

Bursae: No bursitis.

Muscles and tendons

Muscles and tendons:  Normal.

Other findings

Miscellaneous: No adenopathy or mass lesions. Extensive
diverticulosis distal descending and sigmoid portions of the colon
without diverticulitis.
IMPRESSION: 1. Slight arthritic changes of both hips, slightly more prominent on
the left than the right.
2. Moderate facet arthritis at L4-5 and L5-S1 on the right.
3. Extensive diverticulosis of the distal descending and sigmoid
portions of the colon without diverticulitis.

## 2021-07-12 ENCOUNTER — Ambulatory Visit: Payer: Medicare PPO | Admitting: Dermatology

## 2021-07-12 ENCOUNTER — Encounter: Payer: Self-pay | Admitting: Dermatology

## 2021-07-12 DIAGNOSIS — L820 Inflamed seborrheic keratosis: Secondary | ICD-10-CM

## 2021-07-12 DIAGNOSIS — D0330 Melanoma in situ of unspecified part of face: Secondary | ICD-10-CM

## 2021-07-12 DIAGNOSIS — L578 Other skin changes due to chronic exposure to nonionizing radiation: Secondary | ICD-10-CM

## 2021-07-12 DIAGNOSIS — D0339 Melanoma in situ of other parts of face: Secondary | ICD-10-CM

## 2021-07-12 DIAGNOSIS — L570 Actinic keratosis: Secondary | ICD-10-CM | POA: Diagnosis not present

## 2021-07-12 NOTE — Progress Notes (Signed)
? ?Follow-Up Visit ?  ?Subjective  ?Grant Miranda is a 88 y.o. 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 Recheck. Hx of LN2 treatment then 2 week round of Imiquimod. Stopped Imiquimod as directed for 7 weeks. Restarted 1 week ago. Used for 2 weeks in late February. This was second round of Tx with Imiquimod). ?The patient has spots, moles and lesions to be evaluated, some may be new or changing and the patient has concerns that these could be cancer. ? ?The following portions of the chart were reviewed this encounter and updated as appropriate:  Tobacco  Allergies  Meds  Problems  Med Hx  Surg Hx  Fam Hx   ?  ?Review of Systems: No other skin or systemic complaints except as noted in HPI or Assessment and Plan. ? ?Objective  ?Well appearing patient in no apparent distress; mood and affect are within normal limits. ? ?A focused examination was performed including face. Relevant physical exam findings are noted in the Assessment and Plan. ? ?right oral commissure ?Mild erythema and peeling R oral commissure ? ? ? ? ?Left Nasal Sidewall x1, right temple x1, scalp x1 (3) ?Erythematous thin papules/macules with gritty scale.  ? ?Superior Mid Forehead x1 ?Erythematous keratotic or waxy stuck-on papule or plaque. ? ? 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 ?Status post liquid nitrogen destruction on March 29, 2021 followed by topical chemotherapy with imiquimod with significant reaction. ?(Patient declined aggressive treatment with surgical intervention due to his age) ? ?Stop Imiquimod for now. Wait 4-6 weeks, restart for 1 more week every night at bedtime as directed. ?See photo of reaction on the right chin. ? ?Related Medications ?imiquimod (ALDARA) 5 % cream ?Apply every night at bedtime ? ?AK (actinic keratosis) (3) ?Left Nasal Sidewall x1, right temple x1, scalp x1 ?Actinic  keratoses are precancerous spots that appear secondary to cumulative UV radiation exposure/sun exposure over time. They are chronic with expected duration over 1 year. A portion of actinic keratoses will progress to squamous cell carcinoma of the skin. It is not possible to reliably predict which spots will progress to skin cancer and so treatment is recommended to prevent development of skin cancer. ? ?Recommend daily broad spectrum sunscreen SPF 30+ to sun-exposed areas, reapply every 2 hours as needed.  ?Recommend staying in the shade or wearing long sleeves, sun glasses (UVA+UVB protection) and wide brim hats (4-inch brim around the entire circumference of the hat). ?Call for new or changing lesions. ? ?Destruction of lesion - Left Nasal Sidewall x1, right temple x1, scalp x1 ?Complexity: simple   ?Destruction method: cryotherapy   ?Informed consent: discussed and consent obtained   ?Timeout:  patient name, date of birth, surgical site, and procedure verified ?Lesion destroyed using liquid nitrogen: Yes   ?Region frozen until ice ball extended beyond lesion: Yes   ?Outcome: patient tolerated procedure well with no complications   ?Post-procedure details: wound care instructions given   ? ?Inflamed seborrheic keratosis ?Superior Mid Forehead x1 ?Irritated ?Destruction of lesion - Superior Mid Forehead x1 ?Complexity: simple   ?Destruction method: cryotherapy   ?Informed consent: discussed and consent obtained   ?Timeout:  patient name, date of birth, surgical site, and procedure verified ?Lesion destroyed using liquid nitrogen: Yes   ?Region frozen until ice ball extended beyond lesion: Yes   ?Outcome: patient tolerated procedure well with no complications   ?  Post-procedure details: wound care instructions given   ? ?Actinic Damage ?- chronic, secondary to cumulative UV radiation exposure/sun exposure over time ?- diffuse scaly erythematous macules with underlying dyspigmentation ?- Recommend daily broad spectrum  sunscreen SPF 30+ to sun-exposed areas, reapply every 2 hours as needed.  ?- Recommend staying in the shade or wearing long sleeves, sun glasses (UVA+UVB protection) and wide brim hats (4-inch brim around the entire circumference of the hat). ?- Call for new or changing lesions. ? ?Return in about 3 months (around 10/11/2021) for MIS Follow Up. ? ?I, Emelia Salisbury, CMA, am acting as scribe for Sarina Ser, MD. ?Documentation: I have reviewed the above documentation for accuracy and completeness, and I agree with the above. ? ?Sarina Ser, MD ? ? ? ?

## 2021-07-12 NOTE — Patient Instructions (Addendum)
Stop Imiquimod for now.  ? ?Wait 4-6 weeks, restart  Imiquimod treatment for 1 more week every night at bedtime as directed. ? ?Cryotherapy Aftercare ? ?Wash gently with soap and water everyday.   ?Apply Vaseline and Band-Aid daily until healed.  ? ?Prior to procedure, discussed risks of blister formation, small wound, skin dyspigmentation, or rare scar following cryotherapy. Recommend Vaseline ointment to treated areas while healing.  ? ? ? ?Reviewed expected reaction when using imiquimod cream, including irritation and mild inflammation and risk of erosions or more severe inflammation. Reviewed not to apply this in an area larger than 4 x 4 inches to avoid flu-like symptoms. Only a thin layer is required. Reviewed if too much irritation occurs, ensure application of only a thin layer and decrease frequency slightly to achieve a tolerable level of inflammation.  ? ?If You Need Anything After Your Visit ? ?If you have any questions or concerns for your doctor, please call our main line at [phone] and press option 4 to reach your doctor's medical assistant. If no one answers, please leave a voicemail as directed and we will return your call as soon as possible. Messages left after 4 pm will be answered the following business day.  ? ?You may also send [place] a message via MyChart. We typically respond to MyChart messages within 1-2 business days. ? ?For prescription refills, please ask your pharmacy to contact our office. Our fax number is [phone]. ? ?If you have an urgent issue when the clinic is closed that cannot wait until the next business day, you can page your doctor at the number below.   ? ?Please note that while we do our best to be available for urgent issues outside of office hours, we are not available 24/7.  ? ?If you have an urgent issue and are unable to reach [place], you may choose to seek medical care at your doctor's office, retail clinic, urgent care center, or emergency room. ? ?If you have a  medical emergency, please immediately call 911 or go to the emergency department. ? ?Pager Numbers ? ?- Dr. Nehemiah Massed: [phone] ? ?- Dr. Laurence Ferrari: [phone] ? ?- Dr. Nicole Kindred: [phone] ? ?In the event of inclement weather, please call our main line at [phone] for an update on the status of any delays or closures. ? ?Dermatology Medication Tips: ?Please keep the boxes that topical medications come in in order to help keep track of the instructions about where and how to use these. Pharmacies typically print the medication instructions only on the boxes and not directly on the medication tubes.  ? ?If your medication is too expensive, please contact our office at [phone] option 4 or send [place] a message through [place].  ? ?We are unable to tell what your co-pay for medications will be in advance as this is different depending on your insurance coverage. However, we may be able to find a substitute medication at lower cost or fill out paperwork to get insurance to cover a needed medication.  ? ?If a prior authorization is required to get your medication covered by your insurance company, please allow [place] 1-2 business days to complete this process. ? ?Drug prices often vary depending on where the prescription is filled and some pharmacies may offer cheaper prices. ? ?The website www.goodrx.com contains coupons for medications through different pharmacies. The prices here do not account for what the cost may be with help from insurance (it may be cheaper with your insurance), but the website  can give you the price if you did not use any insurance.  ?- You can print the associated coupon and take it with your prescription to the pharmacy.  ?- You may also stop by our office during regular business hours and pick up a GoodRx coupon card.  ?- If you need your prescription sent electronically to a different pharmacy, notify our office through [facility] or by phone at [phone] option 4. ? ? ? ? ?Si  Usted Necesita Algo Despu?s de Su Visita ? ?Tambi?n puede enviarnos un mensaje a trav?s de MyChart. Por lo general respondemos a los mensajes de MyChart en el transcurso de 1 a 2 d?as h?biles. ? ?Para renovar recetas, por favor pida a su farmacia que se ponga en contacto con nuestra oficina. Nuestro n?mero de fax es el [phone]. ? ?Si tiene un asunto urgente cuando la cl?nica est? cerrada y que no puede esperar hasta el siguiente d?a h?bil, puede llamar/localizar a su doctor(a) al n?mero que aparece a continuaci?n.  ? ?Por favor, tenga en cuenta que aunque hacemos todo lo posible para estar disponibles para asuntos urgentes fuera del horario de oficina, no estamos disponibles las 24 horas del d?a, los 7 d?as de la semana.  ? ?Si tiene un problema urgente y no puede comunicarse con nosotros, puede optar por buscar atenci?n m?dica  en el consultorio de su doctor(a), en una cl?nica privada, en un centro de atenci?n urgente o en una sala de emergencias. ? ?Si tiene Engineer, maintenance (IT) m?dica, por favor llame inmediatamente al 911 o vaya a la sala de emergencias. ? ?N?meros de b?per ? ?- Dr. Nehemiah Massed: [phone] ? ?- Dra. Moye: [phone] ? ?- Dra. Nicole Kindred: [phone] ? ?En caso de inclemencias del tiempo, por favor llame a nuestra l?nea principal al [phone] para una actualizaci?n sobre el estado de cualquier retraso o cierre. ? ?Consejos para la medicaci?n en dermatolog?a: ?Por favor, guarde las cajas en las que vienen los medicamentos de uso t?pico para ayudarle a seguir las instrucciones sobre d?nde y c?mo usarlos. Las farmacias generalmente imprimen las instrucciones del medicamento s?lo en las cajas y no directamente en los tubos del [place].  ? ?Si su medicamento es muy caro, por favor, p?ngase en contacto con Zigmund Daniel llamando al [phone] y presione la opci?n 4 o env?enos un mensaje a trav?s de MyChart.  ? ?No podemos decirle cu?l ser? su copago por los medicamentos por adelantado ya que  esto es diferente dependiendo de la cobertura de su seguro. Sin embargo, es posible que podamos encontrar un medicamento sustituto a Electrical engineer un formulario para que el seguro cubra el medicamento que se considera necesario.  ? ?Si se requiere Ardelia Mems autorizaci?n previa para que su compa??a de seguros [place] su medicamento, por favor perm?tanos de 1 a 2 d?as h?biles para completar este proceso. ? ?Los precios de los medicamentos var?an con frecuencia dependiendo del Environmental consultant de d?nde se surte la receta y alguna farmacias pueden ofrecer precios m?s baratos. ? ?El sitio web www.goodrx.com tiene cupones para medicamentos de Airline pilot. Los precios aqu? no tienen en cuenta lo que podr?a costar con la ayuda del seguro (puede ser m?s barato con su seguro), pero el sitio web puede darle el precio si no utiliz? ning?n seguro.  ?- Puede imprimir el cup?n correspondiente y llevarlo con su receta a la farmacia.  ?- Tambi?n puede pasar por nuestra oficina durante el horario de atenci?n regular y recoger una tarjeta de cupones de GoodRx.  ?-  Si necesita que su receta se env?e electr?nicamente a Chiropodist, informe a nuestra oficina a trav?s de MyChart de [place] o por tel?fono llamando al [phone] y presione la opci?n 4.  ?

## 2021-07-19 ENCOUNTER — Encounter: Payer: Self-pay | Admitting: Dermatology

## 2021-07-27 ENCOUNTER — Ambulatory Visit: Payer: Medicare PPO | Admitting: Dermatology

## 2021-10-13 ENCOUNTER — Ambulatory Visit: Payer: Medicare PPO | Admitting: Dermatology

## 2021-10-13 DIAGNOSIS — L820 Inflamed seborrheic keratosis: Secondary | ICD-10-CM

## 2021-10-13 DIAGNOSIS — Z85828 Personal history of other malignant neoplasm of skin: Secondary | ICD-10-CM

## 2021-10-13 DIAGNOSIS — D0339 Melanoma in situ of other parts of face: Secondary | ICD-10-CM | POA: Diagnosis not present

## 2021-10-13 DIAGNOSIS — L814 Other melanin hyperpigmentation: Secondary | ICD-10-CM

## 2021-10-13 DIAGNOSIS — Z1283 Encounter for screening for malignant neoplasm of skin: Secondary | ICD-10-CM

## 2021-10-13 DIAGNOSIS — L578 Other skin changes due to chronic exposure to nonionizing radiation: Secondary | ICD-10-CM

## 2021-10-13 DIAGNOSIS — L821 Other seborrheic keratosis: Secondary | ICD-10-CM

## 2021-10-13 DIAGNOSIS — L570 Actinic keratosis: Secondary | ICD-10-CM | POA: Diagnosis not present

## 2021-10-13 DIAGNOSIS — D229 Melanocytic nevi, unspecified: Secondary | ICD-10-CM

## 2021-10-13 DIAGNOSIS — D1800 Hemangioma unspecified site: Secondary | ICD-10-CM

## 2021-10-13 DIAGNOSIS — B351 Tinea unguium: Secondary | ICD-10-CM

## 2021-10-13 DIAGNOSIS — D0330 Melanoma in situ of unspecified part of face: Secondary | ICD-10-CM

## 2021-10-13 MED ORDER — IMIQUIMOD 5 % EX CREA: TOPICAL_CREAM | CUTANEOUS | 1 refills | Status: DC

## 2021-10-13 NOTE — Patient Instructions (Signed)
Due to recent changes in healthcare laws, you may see results of your pathology and/or laboratory studies on MyChart before the doctors have had a chance to review them. We understand that in some cases there may be results that are confusing or concerning to you. Please understand that not all results are received at the same time and often the doctors may need to interpret multiple results in order to provide you with the best plan of care or course of treatment. Therefore, we ask that you please give us 2 business days to thoroughly review all your results before contacting the office for clarification. Should we see a critical lab result, you will be contacted sooner.   If You Need Anything After Your Visit  If you have any questions or concerns for your doctor, please call our main line at [phone] and press option 4 to reach your doctor's medical assistant. If no one answers, please leave a voicemail as directed and we will return your call as soon as possible. Messages left after 4 pm will be answered the following business day.   You may also send us a message via MyChart. We typically respond to MyChart messages within 1-2 business days.  For prescription refills, please ask your pharmacy to contact our office. Our fax number is [phone].  If you have an urgent issue when the clinic is closed that cannot wait until the next business day, you can page your doctor at the number below.    Please note that while we do our best to be available for urgent issues outside of office hours, we are not available 24/7.   If you have an urgent issue and are unable to reach us, you may choose to seek medical care at your doctor's office, retail clinic, urgent care center, or emergency room.  If you have a medical emergency, please immediately call 911 or go to the emergency department.  Pager Numbers  - Dr. Kowalski: [phone]  - Dr. Moye: [phone]  - Dr. Stewart:  [phone]  In the event of inclement weather, please call our main line at [phone] for an update on the status of any delays or closures.  Dermatology Medication Tips: Please keep the boxes that topical medications come in in order to help keep track of the instructions about where and how to use these. Pharmacies typically print the medication instructions only on the boxes and not directly on the medication tubes.   If your medication is too expensive, please contact our office at [phone] option 4 or send us a message through MyChart.   We are unable to tell what your co-pay for medications will be in advance as this is different depending on your insurance coverage. However, we may be able to find a substitute medication at lower cost or fill out paperwork to get insurance to cover a needed medication.   If a prior authorization is required to get your medication covered by your insurance company, please allow us 1-2 business days to complete this process.  Drug prices often vary depending on where the prescription is filled and some pharmacies may offer cheaper prices.  The website www.goodrx.com contains coupons for medications through different pharmacies. The prices here do not account for what the cost may be with help from insurance (it may be cheaper with your insurance), but the website can give you the price if you did not use any insurance.  - You can print the associated coupon and take it with   your prescription to the pharmacy.  - You may also stop by our office during regular business hours and pick up a GoodRx coupon card.  - If you need your prescription sent electronically to a different pharmacy, notify our office through [facility] MyChart or by phone at [phone] option 4.     Si Usted Necesita Algo Despus de Su Visita  Tambin puede enviarnos un mensaje a travs de MyChart. Por lo general respondemos a los mensajes de MyChart en el transcurso de 1 a 2  das hbiles.  Para renovar recetas, por favor pida a su farmacia que se ponga en contacto con nuestra oficina. Nuestro nmero de fax es el [phone].  Si tiene un asunto urgente cuando la clnica est cerrada y que no puede esperar hasta el siguiente da hbil, puede llamar/localizar a su doctor(a) al nmero que aparece a continuacin.   Por favor, tenga en cuenta que aunque hacemos todo lo posible para estar disponibles para asuntos urgentes fuera del horario de oficina, no estamos disponibles las 24 horas del da, los 7 das de la semana.   Si tiene un problema urgente y no puede comunicarse con nosotros, puede optar por buscar atencin mdica  en el consultorio de su doctor(a), en una clnica privada, en un centro de atencin urgente o en una sala de emergencias.  Si tiene una emergencia mdica, por favor llame inmediatamente al 911 o vaya a la sala de emergencias.  Nmeros de bper  - Dr. Kowalski: [phone]  - Dra. Moye: [phone]  - Dra. Stewart: [phone]  En caso de inclemencias del tiempo, por favor llame a nuestra lnea principal al [phone] para una actualizacin sobre el estado de cualquier retraso o cierre.  Consejos para la medicacin en dermatologa: Por favor, guarde las cajas en las que vienen los medicamentos de uso tpico para ayudarle a seguir las instrucciones sobre dnde y cmo usarlos. Las farmacias generalmente imprimen las instrucciones del medicamento slo en las cajas y no directamente en los tubos del medicamento.   Si su medicamento es muy caro, por favor, pngase en contacto con nuestra oficina llamando al [phone] y presione la opcin 4 o envenos un mensaje a travs de MyChart.   No podemos decirle cul ser su copago por los medicamentos por adelantado ya que esto es diferente dependiendo de la cobertura de su seguro. Sin embargo, es posible que podamos encontrar un medicamento sustituto a menor costo o llenar un formulario para que el  seguro cubra el medicamento que se considera necesario.   Si se requiere una autorizacin previa para que su compaa de seguros cubra su medicamento, por favor permtanos de 1 a 2 das hbiles para completar este proceso.  Los precios de los medicamentos varan con frecuencia dependiendo del lugar de dnde se surte la receta y alguna farmacias pueden ofrecer precios ms baratos.  El sitio web www.goodrx.com tiene cupones para medicamentos de diferentes farmacias. Los precios aqu no tienen en cuenta lo que podra costar con la ayuda del seguro (puede ser ms barato con su seguro), pero el sitio web puede darle el precio si no utiliz ningn seguro.  - Puede imprimir el cupn correspondiente y llevarlo con su receta a la farmacia.  - Tambin puede pasar por nuestra oficina durante el horario de atencin regular y recoger una tarjeta de cupones de GoodRx.  - Si necesita que su receta se enve electrnicamente a una farmacia diferente, informe a nuestra oficina a travs de MyChart de [place]   o por telfono llamando al [phone] y presione la opcin 4.  

## 2021-10-13 NOTE — Progress Notes (Unsigned)
Follow-Up Visit   Subjective  Grant Miranda is a 88 y.o. male who presents for the following: Annual Exam (Hx of BCC, SCC, MMIS, AK's ). The patient presents for Total-Body Skin Exam (TBSE) for skin cancer screening and mole check.  The patient has spots, moles and lesions to be evaluated, some may be new or changing and the patient has concerns that these could be cancer.  The following portions of the chart were reviewed this encounter and updated as appropriate:   Tobacco  Allergies  Meds  Problems  Med Hx  Surg Hx  Fam Hx     Review of Systems:  No other skin or systemic complaints except as noted in HPI or Assessment and Plan.  Objective  Well appearing patient in no apparent distress; mood and affect are within normal limits.  A full examination was performed including scalp, head, eyes, ears, nose, lips, neck, chest, axillae, abdomen, back, buttocks, bilateral upper extremities, bilateral lower extremities, hands, feet, fingers, toes, fingernails, and toenails. All findings within normal limits unless otherwise noted below.  Face, trunk, extremities x 21 (21) Erythematous thin papules/macules with gritty scale.   R scalp x 1 Erythematous stuck-on, waxy papule or plaque  R oral commissure 0.3 x 0.2 brown macule.      Assessment & Plan  AK (actinic keratosis) Face trunk; exts x 21  Destruction of lesion - Face x Complexity: simple   Destruction method: cryotherapy   Informed consent: discussed and consent obtained   Timeout:  patient name, date of birth, surgical site, and procedure verified Lesion destroyed using liquid nitrogen: Yes   Region frozen until ice ball extended beyond lesion: Yes   Outcome: patient tolerated procedure well with no complications   Post-procedure details: wound care instructions given    Inflamed seborrheic keratosis R scalp x 1  Destruction of lesion - R scalp x 1 Complexity: simple   Destruction method: cryotherapy    Informed consent: discussed and consent obtained   Timeout:  patient name, date of birth, surgical site, and procedure verified Lesion destroyed using liquid nitrogen: Yes   Region frozen until ice ball extended beyond lesion: Yes   Outcome: patient tolerated procedure well with no complications   Post-procedure details: wound care instructions given    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   In one month (September 1st) restart Imiquimod QHS x 7 days to aa.  Related Medications imiquimod (ALDARA) 5 % cream Apply to aa's QHS x 7 days.  Tinea unguium Toenails Vs trauma vs both -  Patient being evaluated by [facility] we will wait until follow up appointment in 3 mths.   Lentigines - Scattered tan macules - Due to sun exposure - Benign-appearing, observe - Recommend daily broad spectrum sunscreen SPF 30+ to sun-exposed areas, reapply every 2 hours as needed. - Call for any changes  Seborrheic Keratoses - Stuck-on, waxy, tan-brown papules and/or plaques  - Benign-appearing - Discussed  benign etiology and prognosis. - Observe - Call for any changes  Melanocytic Nevi - Tan-brown and/or pink-flesh-colored symmetric macules and papules - Benign appearing on exam today - Observation - Call clinic for new or changing moles - Recommend daily use of broad spectrum spf 30+ sunscreen to sun-exposed areas.   Hemangiomas - Red papules -  Discussed benign nature - Observe - Call for any changes  Actinic Damage - Chronic condition, secondary to cumulative UV/sun exposure - diffuse scaly erythematous macules with underlying dyspigmentation - Recommend daily broad spectrum sunscreen SPF 30+ to sun-exposed areas, reapply every 2 hours as needed.  - Staying in the shade or wearing long sleeves, sun glasses (UVA+UVB protection) and wide brim hats (4-inch brim around the entire circumference of the hat) are also recommended for sun protection.  - Call for new or changing lesions.  History of Basal Cell Carcinoma of the Skin - No evidence of recurrence today - Recommend regular full body skin exams - Recommend daily broad spectrum sunscreen SPF 30+ to sun-exposed areas, reapply every 2 hours as needed.  - Call if any new or changing lesions are noted between office visits  History of Squamous Cell Carcinoma of the Skin - No evidence of recurrence today - No lymphadenopathy - Recommend regular full body skin exams - Recommend daily broad spectrum sunscreen SPF 30+ to sun-exposed areas, reapply every 2 hours as needed.  - Call if any new or changing lesions are noted between office visits  Skin cancer screening performed today.  Return in about 3 months (around 01/13/2022) for TBSE - hx MMIS, BCC, SCC, AK's.  Luther Redo, CMA, am acting as scribe for Sarina Ser, MD . Documentation: I have reviewed the above documentation for accuracy and completeness, and I agree with the above.  Sarina Ser, MD

## 2021-10-16 ENCOUNTER — Encounter: Payer: Self-pay | Admitting: Dermatology

## 2021-10-17 ENCOUNTER — Telehealth: Payer: Self-pay

## 2021-10-17 NOTE — Telephone Encounter (Signed)
Patient returned call to let you know 21 places were treated. aw

## 2021-10-17 NOTE — Telephone Encounter (Signed)
-----   Message from Ralene Bathe, MD sent at 10/16/2021 10:11 PM EDT ----- Please at the number of AK's treated on this patient's face. I think it was a lot.  If you recall, if it was greater than 15, you can just put greater than 15. Thanks

## 2021-10-17 NOTE — Telephone Encounter (Signed)
Left message on voicemail to return my call.  

## 2021-11-04 ENCOUNTER — Inpatient Hospital Stay: Payer: Medicare PPO | Attending: Internal Medicine

## 2021-11-11 ENCOUNTER — Inpatient Hospital Stay: Payer: Medicare PPO | Admitting: Medical Oncology

## 2021-11-29 ENCOUNTER — Other Ambulatory Visit (INDEPENDENT_AMBULATORY_CARE_PROVIDER_SITE_OTHER): Payer: Self-pay | Admitting: Nurse Practitioner

## 2021-11-29 DIAGNOSIS — I739 Peripheral vascular disease, unspecified: Secondary | ICD-10-CM

## 2021-12-02 ENCOUNTER — Ambulatory Visit (INDEPENDENT_AMBULATORY_CARE_PROVIDER_SITE_OTHER): Payer: Medicare PPO | Admitting: Vascular Surgery

## 2021-12-02 ENCOUNTER — Ambulatory Visit (INDEPENDENT_AMBULATORY_CARE_PROVIDER_SITE_OTHER): Payer: Medicare PPO

## 2021-12-02 ENCOUNTER — Encounter (INDEPENDENT_AMBULATORY_CARE_PROVIDER_SITE_OTHER): Payer: Self-pay | Admitting: Vascular Surgery

## 2021-12-02 VITALS — BP 150/74 | HR 79 | Resp 16 | Wt 150.0 lb

## 2021-12-02 DIAGNOSIS — M5126 Other intervertebral disc displacement, lumbar region: Secondary | ICD-10-CM

## 2021-12-02 DIAGNOSIS — I739 Peripheral vascular disease, unspecified: Secondary | ICD-10-CM

## 2021-12-02 NOTE — Progress Notes (Signed)
Patient ID: Grant Miranda, male   DOB: Jan 15, 1933, 88 y.o.   MRN: [id number]  Chief Complaint  Patient presents with   New Patient (Initial Visit)    Ref Kary Kos consult PAD with abi    HPI Grant Miranda is a 88 y.o. male.  I am asked to see the patient by Dr. Kary Kos for evaluation of an abnormal home health PAD screening exam.  Patient says he is not really had any problems with his legs as far as he knows other than previous issues with sciatica.  He has had multiple injections in the past for this.  No disabling claudication, rest pain, or ulceration.  His ABIs today are 1.07 on the right and 1.19 on the left with triphasic waveforms and normal digital pressures bilaterally.     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 Right paraspinal lower back. Excised: 01/08/2013   Colon polyps    Esophageal stricture    GERD (gastroesophageal reflux disease)    History of basal cell cancer    left temple   History of melanoma    chest and leg   Hx of basal cell carcinoma 01/16/2018   Left ear on conchal side of the mid anti helix. Nodular pattern   Hypertension    Lumbar disc herniation    Lumbar radiculitis 06/23/2014   Melanoma ([place]) 09/07/2008   Right lower medial knee. MIS, LM type, margins free of tumor. Excision: 09/28/2008   Melanoma in situ ([place]) 03/23/2021   Right oral commissure, LN2 03/29/21, plan Imiquimod on f/u   Osteoarthritis of spine with radiculopathy, lumbar region 06/08/2014   chronic   Prostate cancer ([place])    Positive for SEED implantation   Scoliosis 06/08/2014   Degenerative scoliosis in adult patient; chronic   Spinal stenosis of lumbar region at multiple levels 06/08/2014   Chronic   Squamous cell carcinoma of skin 11/10/2008   Left medial lower leg above ankle. Well differentiated. Excised: 12/02/2018, margins free   Squamous cell carcinoma of skin 05/24/2009   Right  upper med. knee. Well differentiated.   Squamous cell carcinoma of skin 12/28/2009   Left vertex scalp. SCCis arising in AK   Squamous cell carcinoma of skin 09/11/2012   Right medial mid thigh. KA pattern   Squamous cell carcinoma of skin 09/11/2012   Left knee. Well differentiated.   Squamous cell carcinoma of skin 12/10/2014   Left medial proximal pretibial below knee. KA pattern   Squamous cell carcinoma of skin 12/01/2015   Right anterior crown. [place] with SCCis. Tx: EDC   Squamous cell carcinoma of skin 07/16/2017   Left upper eyelid. SCCis arising in AK   Squamous cell carcinoma of skin 06/25/2019   Left ant. lat. forehead at hairline. MD SCC, crusted.    Squamous cell carcinoma of skin 11/11/2020   Right chest, EDC   Ulcerative colitis ([place])    left sided    Past Surgical History:  Procedure Laterality Date   CATARACT EXTRACTION W/PHACO Left 01/12/2021   Procedure: CATARACT EXTRACTION PHACO AND INTRAOCULAR LENS PLACEMENT ([place]) LEFT;  Surgeon: Leandrew Koyanagi, MD;  Location: [facility];  Service: Ophthalmology;  Laterality: Left;  7.13 01:05.3   CATARACT EXTRACTION W/PHACO Right 01/26/2021   Procedure: CATARACT EXTRACTION PHACO AND INTRAOCULAR LENS PLACEMENT ([facility]) RIGHT 12.79 01:36.0;  Surgeon: Leandrew Koyanagi, MD;  Location: [facility];  Service: Ophthalmology;  Laterality: Right;   COLONOSCOPY  03/2004; 10/2007; 12/2008; 01/2010   PH of adenomatous polyps; FH colon polyps (sister)   COLONOSCOPY WITH PROPOFOL N/A 04/02/2015   Procedure: COLONOSCOPY WITH PROPOFOL;  Surgeon: Manya Silvas, MD;  Location: [facility];  Service: Endoscopy;  Laterality: N/A;   ESOPHAGOGASTRODUODENOSCOPY  02/05/2004   HERNIA REPAIR     JOINT REPLACEMENT Left 2006   Shoulder replacement   Pnuemovax  12/2009   Seed implant for treatment of prostate cancer     Performed in [facility]       Family History  Problem Relation Age of Onset   Breast  cancer Sister      Social History   Tobacco Use   Smoking status: Never   Smokeless tobacco: Never  Vaping Use   Vaping Use: Never used  Substance Use Topics   Alcohol use: Yes    Alcohol/week: 1.0 standard drink of alcohol    Types: 1 Glasses of wine per week   Drug use: No     Allergies  Allergen Reactions   Hydrochlorothiazide Rash   Norvasc [Amlodipine] Rash   Penicillin V Potassium Rash   Sulfa Antibiotics Rash    Current Outpatient Medications  Medication Sig Dispense Refill   ACETAMINOPHEN 8 HOUR PO Take 650 mg by mouth as needed.     celecoxib (CELEBREX) 200 MG capsule Take 200 mg by mouth daily.      diphenhydramine-acetaminophen (TYLENOL PM) 25-500 MG TABS tablet Take 2 tablets by mouth at bedtime as needed.     docusate sodium (COLACE) 100 MG capsule Take 100 mg by mouth 2 (two) times daily.     imiquimod (ALDARA) 5 % cream Apply to aa's QHS x 7 days. 24 each 1   ketoconazole (NIZORAL) 2 % shampoo Apply 1 application topically 3 (three) times a week. Wash scalp 3 times a week, let sit 5 minutes before rinsing out 120 mL 4   Mesalamine 800 MG TBEC Take by mouth.     Multiple Vitamins-Minerals (MULTIVITAMIN ADULT) TABS Take by mouth.     mupirocin ointment (BACTROBAN) 2 % Apply to wound once daily until healed 22 g 3   mupirocin ointment (BACTROBAN) 2 % Apply 1 application topically daily. Qd to wound on face 22 g 1   Omega-3 Fatty Acids (FISH OIL CONCENTRATE) 1000 MG CAPS Take 1 capsule by mouth daily.     omeprazole (PRILOSEC) 20 MG capsule Take 20 mg by mouth daily.     tacrolimus (PROTOPIC) 0.1 % ointment Apply topically in the morning and at bedtime. 60 g 2   valsartan (DIOVAN) 320 MG tablet Take 320 mg by mouth daily.     Zinc Sulfate (ZINC 15 PO) Take 30 mg by mouth daily.     sildenafil (REVATIO) 20 MG tablet Take 20-100 mg by mouth as needed. (Patient not taking: Reported on 12/30/2020)     No current facility-administered medications for this visit.       REVIEW OF SYSTEMS (Negative unless checked)  Constitutional: '[]'$ Weight loss  '[]'$ Fever  '[]'$ Chills Cardiac: '[]'$ Chest pain   '[]'$ Chest pressure   '[]'$ Palpitations   '[]'$ Shortness of breath when laying flat   '[]'$ Shortness of breath at rest   '[]'$ Shortness of breath with exertion. Vascular:  '[]'$ Pain in legs with walking   '[]'$ Pain in legs at rest   '[]'$ Pain in legs when laying flat   '[]'$ Claudication   '[]'$ Pain in feet when walking  '[]'$ Pain in feet  at rest  '[]'$ Pain in feet when laying flat   '[]'$ History of DVT   '[]'$ Phlebitis   '[]'$ Swelling in legs   '[]'$ Varicose veins   '[]'$ Non-healing ulcers Pulmonary:   '[]'$ Uses home oxygen   '[]'$ Productive cough   '[]'$ Hemoptysis   '[]'$ Wheeze  '[]'$ COPD   '[]'$ Asthma Neurologic:  '[]'$ Dizziness  '[]'$ Blackouts   '[]'$ Seizures   '[]'$ History of stroke   '[]'$ History of TIA  '[]'$ Aphasia   '[]'$ Temporary blindness   '[]'$ Dysphagia   '[]'$ Weakness or numbness in arms   '[]'$ Weakness or numbness in legs Musculoskeletal:  '[x]'$ Arthritis   '[]'$ Joint swelling   '[]'$ Joint pain   '[x]'$ Low back pain Hematologic:  '[]'$ Easy bruising  '[]'$ Easy bleeding   '[]'$ Hypercoagulable state   '[]'$ Anemic  '[]'$ Hepatitis Gastrointestinal:  '[]'$ Blood in stool   '[]'$ Vomiting blood  '[x]'$ Gastroesophageal reflux/heartburn   '[]'$ Abdominal pain Genitourinary:  '[]'$ Chronic kidney disease   '[]'$ Difficult urination  '[]'$ Frequent urination  '[]'$ Burning with urination   '[]'$ Hematuria Skin:  '[]'$ Rashes   '[]'$ Ulcers   '[]'$ Wounds Psychological:  '[]'$ History of anxiety   '[]'$  History of major depression.    Physical Exam BP (!) 150/74 (BP Location: Right Arm)   Pulse 79   Resp 16   Wt 150 lb (68 kg)   BMI 22.15 kg/m  Gen:  WD/WN, NAD Head: [place]/AT, No temporalis wasting. Ear/Nose/Throat: Hearing grossly intact, nares w/o erythema or drainage, oropharynx w/o Erythema/Exudate Eyes: Conjunctiva clear, sclera non-icteric  Neck: trachea midline.  No JVD.  Pulmonary:  Good air movement, respirations not labored, no use of accessory muscles  Cardiac: RRR, no JVD Vascular:  Vessel Right Left  Radial Palpable  Palpable                          PT Palpable Palpable  DP Palpable Palpable   Gastrointestinal:. No masses, surgical incisions, or scars. Musculoskeletal: M/S 5/5 throughout.  Extremities without ischemic changes.  No deformity or atrophy.  No significant lower extremity edema. Neurologic: Sensation grossly intact in extremities.  Symmetrical.  Speech is a little difficult to understand. Motor exam as listed above. Psychiatric: Judgment intact, Mood & affect appropriate for pt's clinical situation. Dermatologic: No rashes or ulcers noted.  No cellulitis or open wounds.    Radiology No results found.  Labs No results found for this or any previous visit (from the past 2160 hour(s)).  Assessment/Plan:  PAD (peripheral artery disease) ([place]) The patient had an abnormal home health screening study which suggested significant peripheral arterial disease.His ABIs today are 1.07 on the right and 1.19 on the left with triphasic waveforms and normal digital pressures bilaterally.  He does not appear to have significant peripheral arterial disease and given his advanced age and normal studies, I will not plan any follow-up studies or work-up unless he develops significant symptoms.  I will see him back as needed.   Lumbar disc herniation Previous sciatica history of but no arterial claudication of the lower extremities.      Leotis Pain 12/02/2021, 10:56 AM   This note was created with Dragon medical transcription system.  Any errors from dictation are unintentional.

## 2021-12-02 NOTE — Assessment & Plan Note (Signed)
Previous sciatica history of but no arterial claudication of the lower extremities.

## 2021-12-02 NOTE — Assessment & Plan Note (Signed)
The patient had an abnormal home health screening study which suggested significant peripheral arterial disease.His ABIs today are 1.07 on the right and 1.19 on the left with triphasic waveforms and normal digital pressures bilaterally.  He does not appear to have significant peripheral arterial disease and given his advanced age and normal studies, I will not plan any follow-up studies or work-up unless he develops significant symptoms.  I will see him back as needed.

## 2022-01-16 ENCOUNTER — Encounter (INDEPENDENT_AMBULATORY_CARE_PROVIDER_SITE_OTHER): Payer: Self-pay

## 2022-01-17 ENCOUNTER — Ambulatory Visit: Payer: Medicare PPO | Admitting: Dermatology

## 2022-01-17 ENCOUNTER — Encounter: Payer: Self-pay | Admitting: Dermatology

## 2022-01-17 DIAGNOSIS — Z8589 Personal history of malignant neoplasm of other organs and systems: Secondary | ICD-10-CM

## 2022-01-17 DIAGNOSIS — Z86006 Personal history of melanoma in-situ: Secondary | ICD-10-CM | POA: Diagnosis not present

## 2022-01-17 DIAGNOSIS — Z85820 Personal history of malignant melanoma of skin: Secondary | ICD-10-CM

## 2022-01-17 DIAGNOSIS — D229 Melanocytic nevi, unspecified: Secondary | ICD-10-CM

## 2022-01-17 DIAGNOSIS — L570 Actinic keratosis: Secondary | ICD-10-CM | POA: Diagnosis not present

## 2022-01-17 DIAGNOSIS — L821 Other seborrheic keratosis: Secondary | ICD-10-CM

## 2022-01-17 DIAGNOSIS — Z1283 Encounter for screening for malignant neoplasm of skin: Secondary | ICD-10-CM

## 2022-01-17 DIAGNOSIS — L820 Inflamed seborrheic keratosis: Secondary | ICD-10-CM

## 2022-01-17 DIAGNOSIS — Z85828 Personal history of other malignant neoplasm of skin: Secondary | ICD-10-CM | POA: Diagnosis not present

## 2022-01-17 DIAGNOSIS — L578 Other skin changes due to chronic exposure to nonionizing radiation: Secondary | ICD-10-CM

## 2022-01-17 DIAGNOSIS — L814 Other melanin hyperpigmentation: Secondary | ICD-10-CM

## 2022-01-17 NOTE — Patient Instructions (Addendum)
Cryotherapy Aftercare  Wash gently with soap and water everyday.   Apply Vaseline daily until healed.    Recommend daily broad spectrum sunscreen SPF 30+ to sun-exposed areas, reapply every 2 hours as needed. Call for new or changing lesions.  Staying in the shade or wearing long sleeves, sun glasses (UVA+UVB protection) and wide brim hats (4-inch brim around the entire circumference of the hat) are also recommended for sun protection.    Melanoma ABCDEs  Melanoma is the most dangerous type of skin cancer, and is the leading cause of death from skin disease.  You are more likely to develop melanoma if you: Have light-colored skin, light-colored eyes, or red or blond hair Spend a lot of time in the sun Tan regularly, either outdoors or in a tanning bed Have had blistering sunburns, especially during childhood Have a close family member who has had a melanoma Have atypical moles or large birthmarks  Early detection of melanoma is key since treatment is typically straightforward and cure rates are extremely high if we catch it early.   The first sign of melanoma is often a change in a mole or a new dark spot.  The ABCDE system is a way of remembering the signs of melanoma.  A for asymmetry:  The two halves do not match. B for border:  The edges of the growth are irregular. C for color:  A mixture of colors are present instead of an even brown color. D for diameter:  Melanomas are usually (but not always) greater than 6mm - the size of a pencil eraser. E for evolution:  The spot keeps changing in size, shape, and color.  Please check your skin once per month between visits. You can use a small mirror in front and a large mirror behind you to keep an eye on the back side or your body.   If you see any new or changing lesions before your next follow-up, please call to schedule a visit.  Please continue daily skin protection including broad spectrum sunscreen SPF 30+ to sun-exposed areas,  reapplying every 2 hours as needed when you're outdoors.   Staying in the shade or wearing long sleeves, sun glasses (UVA+UVB protection) and wide brim hats (4-inch brim around the entire circumference of the hat) are also recommended for sun protection.       Due to recent changes in healthcare laws, you may see results of your pathology and/or laboratory studies on MyChart before the doctors have had a chance to review them. We understand that in some cases there may be results that are confusing or concerning to you. Please understand that not all results are received at the same time and often the doctors may need to interpret multiple results in order to provide you with the best plan of care or course of treatment. Therefore, we ask that you please give us 2 business days to thoroughly review all your results before contacting the office for clarification. Should we see a critical lab result, you will be contacted sooner.   If You Need Anything After Your Visit  If you have any questions or concerns for your doctor, please call our main line at [phone] and press option 4 to reach your doctor's medical assistant. If no one answers, please leave a voicemail as directed and we will return your call as soon as possible. Messages left after 4 pm will be answered the following business day.   You may also send us a message   via MyChart. We typically respond to MyChart messages within 1-2 business days.  For prescription refills, please ask your pharmacy to contact our office. Our fax number is [phone].  If you have an urgent issue when the clinic is closed that cannot wait until the next business day, you can page your doctor at the number below.    Please note that while we do our best to be available for urgent issues outside of office hours, we are not available 24/7.   If you have an urgent issue and are unable to reach us, you may choose to seek medical care at your doctor's  office, retail clinic, urgent care center, or emergency room.  If you have a medical emergency, please immediately call 911 or go to the emergency department.  Pager Numbers  - Dr. Kowalski: [phone]  - Dr. Moye: [phone]  - Dr. Stewart: [phone]  In the event of inclement weather, please call our main line at [phone] for an update on the status of any delays or closures.  Dermatology Medication Tips: Please keep the boxes that topical medications come in in order to help keep track of the instructions about where and how to use these. Pharmacies typically print the medication instructions only on the boxes and not directly on the medication tubes.   If your medication is too expensive, please contact our office at [phone] option 4 or send us a message through MyChart.   We are unable to tell what your co-pay for medications will be in advance as this is different depending on your insurance coverage. However, we may be able to find a substitute medication at lower cost or fill out paperwork to get insurance to cover a needed medication.   If a prior authorization is required to get your medication covered by your insurance company, please allow us 1-2 business days to complete this process.  Drug prices often vary depending on where the prescription is filled and some pharmacies may offer cheaper prices.  The website www.goodrx.com contains coupons for medications through different pharmacies. The prices here do not account for what the cost may be with help from insurance (it may be cheaper with your insurance), but the website can give you the price if you did not use any insurance.  - You can print the associated coupon and take it with your prescription to the pharmacy.  - You may also stop by our office during regular business hours and pick up a GoodRx coupon card.  - If you need your prescription sent electronically to a different pharmacy, notify our office  through [place] MyChart or by phone at [phone] option 4.     Si Usted Necesita Algo Despus de Su Visita  Tambin puede enviarnos un mensaje a travs de MyChart. Por lo general respondemos a los mensajes de MyChart en el transcurso de 1 a 2 das hbiles.  Para renovar recetas, por favor pida a su farmacia que se ponga en contacto con nuestra oficina. Nuestro nmero de fax es el [phone].  Si tiene un asunto urgente cuando la clnica est cerrada y que no puede esperar hasta el siguiente da hbil, puede llamar/localizar a su doctor(a) al nmero que aparece a continuacin.   Por favor, tenga en cuenta que aunque hacemos todo lo posible para estar disponibles para asuntos urgentes fuera del horario de oficina, no estamos disponibles las 24 horas del da, los 7 das de la semana.   Si tiene un problema urgente y   no puede comunicarse con nosotros, puede optar por buscar atencin mdica  en el consultorio de su doctor(a), en una clnica privada, en un centro de atencin urgente o en una sala de emergencias.  Si tiene una emergencia mdica, por favor llame inmediatamente al 911 o vaya a la sala de emergencias.  Nmeros de bper  - Dr. Kowalski: [phone]  - Dra. Moye: [phone]  - Dra. Stewart: [phone]  En caso de inclemencias del tiempo, por favor llame a nuestra lnea principal al [phone] para una actualizacin sobre el estado de cualquier retraso o cierre.  Consejos para la medicacin en dermatologa: Por favor, guarde las cajas en las que vienen los medicamentos de uso tpico para ayudarle a seguir las instrucciones sobre dnde y cmo usarlos. Las farmacias generalmente imprimen las instrucciones del medicamento slo en las cajas y no directamente en los tubos del medicamento.   Si su medicamento es muy caro, por favor, pngase en contacto con nuestra oficina llamando al [phone] y presione la opcin 4 o envenos un mensaje a travs de MyChart.   No  podemos decirle cul ser su copago por los medicamentos por adelantado ya que esto es diferente dependiendo de la cobertura de su seguro. Sin embargo, es posible que podamos encontrar un medicamento sustituto a menor costo o llenar un formulario para que el seguro cubra el medicamento que se considera necesario.   Si se requiere una autorizacin previa para que su compaa de seguros cubra su medicamento, por favor permtanos de 1 a 2 das hbiles para completar este proceso.  Los precios de los medicamentos varan con frecuencia dependiendo del lugar de dnde se surte la receta y alguna farmacias pueden ofrecer precios ms baratos.  El sitio web www.goodrx.com tiene cupones para medicamentos de diferentes farmacias. Los precios aqu no tienen en cuenta lo que podra costar con la ayuda del seguro (puede ser ms barato con su seguro), pero el sitio web puede darle el precio si no utiliz ningn seguro.  - Puede imprimir el cupn correspondiente y llevarlo con su receta a la farmacia.  - Tambin puede pasar por nuestra oficina durante el horario de atencin regular y recoger una tarjeta de cupones de GoodRx.  - Si necesita que su receta se enve electrnicamente a una farmacia diferente, informe a nuestra oficina a travs de MyChart de [place] o por telfono llamando al [phone] y presione la opcin 4.  

## 2022-01-17 NOTE — Progress Notes (Unsigned)
Follow-Up Visit   Subjective  Grant Miranda is a 88 y.o. male who presents for the following: Annual Exam (HxMIS, HxSCCs, HxAKs, HxBCC. Patient feels dizzy today. Defers full body skin exam). The patient presents for Upper Body Skin Exam (UBSE) for skin cancer screening and mole check.  The patient has spots, moles and lesions to be evaluated, some may be new or changing and the patient has concerns that these could be cancer.  The following portions of the chart were reviewed this encounter and updated as appropriate:  Tobacco  Allergies  Meds  Problems  Med Hx  Surg Hx  Fam Hx     Review of Systems: No other skin or systemic complaints except as noted in HPI or Assessment and Plan.  Objective  Well appearing patient in no apparent distress; mood and affect are within normal limits.  All skin waist up examined. Left posterior leg  Scalp, face, ears x15 (15) Erythematous thin papules/macules with gritty scale.   Left Medial Thigh Erythematous keratotic or waxy stuck-on papule or plaque.   Assessment & Plan   History of Melanoma in Situ. Right lower medial knee, 2010. 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 Tx with LN2 and Imiquimod.  Right oral commissure is Clinically clear today - will observe for any recurrence and consider repeat Imiquimod treatment if needed. - No evidence of recurrence today - No lymphadenopathy - Recommend regular full body skin exams - Recommend daily broad spectrum sunscreen SPF 30+ to sun-exposed areas, reapply every 2 hours as needed.  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 01/08/2013 - No evidence of recurrence today - Recommend regular full body skin exams - Recommend daily broad spectrum sunscreen SPF 30+ to sun-exposed areas, reapply every 2 hours as needed.  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 01/16/2018 - No evidence of recurrence today - Recommend regular full body skin exams - Recommend daily broad spectrum sunscreen SPF 30+ to sun-exposed areas, reapply every 2 hours as needed.  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 - No evidence of recurrence today - No lymphadenopathy - Recommend regular full body skin exams - Recommend daily broad spectrum sunscreen SPF 30+ to sun-exposed areas, reapply every 2 hours as needed.  - Call if any new or changing lesions are noted between office visits  Lentigines - Scattered tan macules - Due to sun exposure - Benign-appearing, observe - Recommend daily broad spectrum sunscreen SPF 30+ to sun-exposed areas, reapply every 2 hours as needed. - Call for any changes  Seborrheic Keratoses - Stuck-on, waxy, tan-brown papules and/or plaques  - Benign-appearing - Discussed benign etiology and prognosis. - Observe - Call for any changes  Melanocytic Nevi - Tan-brown and/or pink-flesh-colored symmetric macules and papules - Benign appearing on exam today - Observation - Call clinic for new or changing moles - Recommend daily use of broad spectrum spf 30+ sunscreen to sun-exposed areas.   Hemangiomas - Red papules - Discussed benign nature - Observe - Call for any changes  Actinic Damage - Chronic condition, secondary to cumulative UV/sun exposure - diffuse scaly erythematous macules with underlying dyspigmentation - Recommend daily broad spectrum sunscreen  SPF 30+ to sun-exposed areas, reapply every 2 hours as needed.  - Staying in the shade or wearing long sleeves, sun glasses (UVA+UVB protection) and wide brim hats (4-inch brim around the entire circumference of the hat) are also recommended for sun protection.  - Call for new or changing  lesions.  Skin cancer screening performed today.  AK (actinic keratosis) (15) Scalp, face, ears x15 Hypertrophic AKs Actinic keratoses are precancerous spots that appear secondary to cumulative UV radiation exposure/sun exposure over time. They are chronic with expected duration over 1 year. A portion of actinic keratoses will progress to squamous cell carcinoma of the skin. It is not possible to reliably predict which spots will progress to skin cancer and so treatment is recommended to prevent development of skin cancer.  Recommend daily broad spectrum sunscreen SPF 30+ to sun-exposed areas, reapply every 2 hours as needed.  Recommend staying in the shade or wearing long sleeves, sun glasses (UVA+UVB protection) and wide brim hats (4-inch brim around the entire circumference of the hat). Call for new or changing lesions.  Will Tx AK's on neck and shoulders at next visit.   Destruction of lesion - Scalp, face, ears x15 Complexity: simple   Destruction method: cryotherapy   Informed consent: discussed and consent obtained   Timeout:  patient name, date of birth, surgical site, and procedure verified Lesion destroyed using liquid nitrogen: Yes   Region frozen until ice ball extended beyond lesion: Yes   Outcome: patient tolerated procedure well with no complications   Post-procedure details: wound care instructions given   Additional details:  Prior to procedure, discussed risks of blister formation, small wound, skin dyspigmentation, or rare scar following cryotherapy. Recommend Vaseline ointment to treated areas while healing.   Inflamed seborrheic keratosis Left Medial Thigh Symptomatic, irritating, patient would like treated. Destruction of lesion - Left Medial Thigh Complexity: simple   Destruction method: cryotherapy   Informed consent: discussed and consent obtained   Timeout:  patient name, date of birth, surgical site, and procedure verified Lesion destroyed using liquid  nitrogen: Yes   Region frozen until ice ball extended beyond lesion: Yes   Outcome: patient tolerated procedure well with no complications   Post-procedure details: wound care instructions given   Additional details:  Prior to procedure, discussed risks of blister formation, small wound, skin dyspigmentation, or rare scar following cryotherapy. Recommend Vaseline ointment to treated areas while healing.   Return in about 3 months (around 04/19/2022) for TBSE, Hx of MIS x2.  I, Emelia Salisbury, CMA, am acting as scribe for Sarina Ser, MD. Documentation: I have reviewed the above documentation for accuracy and completeness, and I agree with the above.  Sarina Ser, MD

## 2022-01-18 ENCOUNTER — Encounter: Payer: Self-pay | Admitting: Dermatology

## 2022-05-01 ENCOUNTER — Ambulatory Visit: Payer: Medicare PPO | Admitting: Dermatology

## 2022-05-15 ENCOUNTER — Ambulatory Visit: Payer: Medicare PPO | Admitting: Dermatology

## 2022-05-15 VITALS — BP 141/91 | HR 81

## 2022-05-15 DIAGNOSIS — D229 Melanocytic nevi, unspecified: Secondary | ICD-10-CM

## 2022-05-15 DIAGNOSIS — B009 Herpesviral infection, unspecified: Secondary | ICD-10-CM | POA: Diagnosis not present

## 2022-05-15 DIAGNOSIS — L820 Inflamed seborrheic keratosis: Secondary | ICD-10-CM

## 2022-05-15 DIAGNOSIS — L814 Other melanin hyperpigmentation: Secondary | ICD-10-CM

## 2022-05-15 DIAGNOSIS — L570 Actinic keratosis: Secondary | ICD-10-CM

## 2022-05-15 DIAGNOSIS — Z79899 Other long term (current) drug therapy: Secondary | ICD-10-CM

## 2022-05-15 DIAGNOSIS — D039 Melanoma in situ, unspecified: Secondary | ICD-10-CM

## 2022-05-15 DIAGNOSIS — I872 Venous insufficiency (chronic) (peripheral): Secondary | ICD-10-CM

## 2022-05-15 DIAGNOSIS — L578 Other skin changes due to chronic exposure to nonionizing radiation: Secondary | ICD-10-CM

## 2022-05-15 DIAGNOSIS — L821 Other seborrheic keratosis: Secondary | ICD-10-CM

## 2022-05-15 DIAGNOSIS — Z1283 Encounter for screening for malignant neoplasm of skin: Secondary | ICD-10-CM | POA: Diagnosis not present

## 2022-05-15 DIAGNOSIS — D0339 Melanoma in situ of other parts of face: Secondary | ICD-10-CM

## 2022-05-15 DIAGNOSIS — Z85820 Personal history of malignant melanoma of skin: Secondary | ICD-10-CM

## 2022-05-15 DIAGNOSIS — D0330 Melanoma in situ of unspecified part of face: Secondary | ICD-10-CM

## 2022-05-15 DIAGNOSIS — Z85828 Personal history of other malignant neoplasm of skin: Secondary | ICD-10-CM

## 2022-05-15 DIAGNOSIS — Z8589 Personal history of malignant neoplasm of other organs and systems: Secondary | ICD-10-CM

## 2022-05-15 DIAGNOSIS — Z5111 Encounter for antineoplastic chemotherapy: Secondary | ICD-10-CM

## 2022-05-15 MED ORDER — IMIQUIMOD 5 % EX CREA: TOPICAL_CREAM | CUTANEOUS | 1 refills | Status: AC

## 2022-05-15 MED ORDER — VALACYCLOVIR HCL 500 MG PO TABS: ORAL_TABLET | ORAL | 1 refills | Status: AC

## 2022-05-15 NOTE — Progress Notes (Signed)
Follow-Up Visit   Subjective  Grant Miranda is a 89 y.o. male who presents for the following: Annual Exam (Hx BCC, MM, MMIS, SCC). The patient presents for Total-Body Skin Exam (TBSE) for skin cancer screening and mole check.  The patient has spots, moles and lesions to be evaluated, some may be new or changing and the patient has concerns that these could be cancer.  The following portions of the chart were reviewed this encounter and updated as appropriate:   Tobacco  Allergies  Meds  Problems  Med Hx  Surg Hx  Fam Hx     Review of Systems:  No other skin or systemic complaints except as noted in HPI or Assessment and Plan.  Objective  Well appearing patient in no apparent distress; mood and affect are within normal limits.  A full examination was performed including scalp, head, eyes, ears, nose, lips, neck, chest, axillae, abdomen, back, buttocks, bilateral upper extremities, bilateral lower extremities, hands, feet, fingers, toes, fingernails, and toenails. All findings within normal limits unless otherwise noted below.  L forearm x 1, L hand x 1, R back x 1, scalp x 1 (4) Erythematous stuck-on, waxy papule or plaque  Hands x 9, shoulders x 3, face x 8 (20) Erythematous thin papules/macules with gritty scale.   R oral commisure      Assessment & Plan  Inflamed seborrheic keratosis (4) L forearm x 1, L hand x 1, R back x 1, scalp x 1 Symptomatic, irritating, patient would like treated. Destruction of lesion - L forearm x 1, L hand x 1, R back x 1, scalp x 1 Complexity: simple   Destruction method: cryotherapy   Informed consent: discussed and consent obtained   Timeout:  patient name, date of birth, surgical site, and procedure verified Lesion destroyed using liquid nitrogen: Yes   Region frozen until ice ball extended beyond lesion: Yes   Outcome: patient tolerated procedure well with no complications   Post-procedure details: wound care instructions given     AK (actinic keratosis) (20) Hands x 9, shoulders x 3, face x 8 Destruction of lesion - Hands x 9, shoulders x 3, face x 8 Complexity: simple   Destruction method: cryotherapy   Informed consent: discussed and consent obtained   Timeout:  patient name, date of birth, surgical site, and procedure verified Lesion destroyed using liquid nitrogen: Yes   Region frozen until ice ball extended beyond lesion: Yes   Outcome: patient tolerated procedure well with no complications   Post-procedure details: wound care instructions given    Melanoma in situ, lentigo maligna type Clinically much improved, but Minimal residual today after LN2 destruction and Imiquimod topical Chemo tx. R oral commisure Start Imiquimod 5% cream to aa QD x 2 weeks.  Reviewed expected reaction when using imiquimod cream, including irritation and mild inflammation and risk of erosions or more severe inflammation. Reviewed not to apply this in an area larger than 4 x 4 inches to avoid flu-like symptoms. Only a thin layer is required. Reviewed if too much irritation occurs, ensure application of only a thin layer and decrease frequency slightly to achieve a tolerable level of inflammation. Will recheck at follow up appointment in 4 months.  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   Start Valtrex 500 mg po QD on day before starting Imiquimod 5% cream. Continue for one week after stopping Imiquimod 5% cream. #30 2RF.   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  It is infectious and can be spread from person to person by direct contact.  It is not curable, but is treatable with topical and oral medication.  valACYclovir (VALTREX) 500 MG tablet - Lips Take one tab po QD starting one day prior to starting Imiquimod 5% cream, and  continue for one week after finishing Imiquimod 5% cream.  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  Recommend daily graduated compression hose/stockings- easiest to put on first thing in morning, remove at bedtime.  Elevate legs as much as possible. Avoid salt/sodium rich foods.   Lentigines - Scattered tan macules - Due to sun exposure - Benign-appearing, observe - Recommend daily broad spectrum sunscreen SPF 30+ to sun-exposed areas, reapply every 2 hours as needed. - Call for any changes  Seborrheic Keratoses - Stuck-on, waxy, tan-brown papules and/or plaques  - Benign-appearing - Discussed benign etiology and prognosis. - Observe - Call for any changes  Melanocytic Nevi - Tan-brown and/or pink-flesh-colored symmetric macules and papules - Benign appearing on exam today - Observation - Call clinic for new or changing moles - Recommend daily use of broad spectrum spf 30+ sunscreen to sun-exposed areas.   Hemangiomas - Red papules - Discussed benign nature - Observe - Call for any changes  Actinic Damage - Chronic condition, secondary to cumulative UV/sun exposure - diffuse scaly erythematous macules with underlying dyspigmentation - Recommend daily broad spectrum sunscreen SPF 30+ to sun-exposed areas, reapply every 2 hours as needed.  - Staying in the shade or wearing long sleeves, sun glasses (UVA+UVB protection) and wide brim hats (4-inch brim around the entire circumference of the hat) are also recommended for sun protection.  - Call for new or changing lesions.  History of Squamous Cell Carcinoma of the Skin - No evidence of recurrence today - No lymphadenopathy - Recommend regular full body skin exams - Recommend daily broad spectrum sunscreen SPF 30+ to sun-exposed areas, reapply  every 2 hours as needed.  - Call if any new or changing lesions are noted between office visits  History of Basal Cell Carcinoma of the Skin - No evidence of recurrence today - Recommend regular full body skin exams - Recommend daily broad spectrum sunscreen SPF 30+ to sun-exposed areas, reapply every 2 hours as needed.  - Call if any new or changing lesions are noted between office visits  Skin cancer screening performed today.  Return in about 4 months (around 09/13/2022) for TBSE.  Luther Redo, CMA, am acting as scribe for Sarina Ser, MD . Documentation: I have reviewed the above documentation for accuracy and completeness, and I agree with the above.  Sarina Ser, MD

## 2022-05-15 NOTE — Patient Instructions (Addendum)
Start Imiquimod 5% cream to melanoma site on the face once daily for two weeks. If too much irritation occurs may stop before two weeks.   Reviewed expected reaction when using imiquimod cream, including irritation and mild inflammation and risk of erosions or more severe inflammation. Reviewed not to apply this in an area larger than 4 x 4 inches to avoid flu-like symptoms. Only a thin layer is required. Reviewed if too much irritation occurs, ensure application of only a thin layer and decrease frequency slightly to achieve a tolerable level of inflammation.    Due to recent changes in healthcare laws, you may see results of your pathology and/or laboratory studies on MyChart before the doctors have had a chance to review them. We understand that in some cases there may be results that are confusing or concerning to you. Please understand that not all results are received at the same time and often the doctors may need to interpret multiple results in order to provide you with the best plan of care or course of treatment. Therefore, we ask that you please give [place] 2 business days to thoroughly review all your results before contacting the office for clarification. Should we see a critical lab result, you will be contacted sooner.   If You Need Anything After Your Visit  If you have any questions or concerns for your doctor, please call our main line at [phone] and press option 4 to reach your doctor's medical assistant. If no one answers, please leave a voicemail as directed and we will return your call as soon as possible. Messages left after 4 pm will be answered the following business day.   You may also send [place] a message via [place]. We typically respond to MyChart messages within 1-2 business days.  For prescription refills, please ask your pharmacy to contact our office. Our fax number is [phone].  If you have an urgent issue when the clinic is closed that cannot wait until the next  business day, you can page your doctor at the number below.    Please note that while we do our best to be available for urgent issues outside of office hours, we are not available 24/7.   If you have an urgent issue and are unable to reach [place], you may choose to seek medical care at your doctor's office, retail clinic, urgent care center, or emergency room.  If you have a medical emergency, please immediately call 911 or go to the emergency department.  Pager Numbers  - Dr. Nehemiah Massed: [phone]  - Dr. Laurence Ferrari: [phone]  - Dr. Nicole Kindred: [phone]  In the event of inclement weather, please call our main line at [phone] for an update on the status of any delays or closures.  Dermatology Medication Tips: Please keep the boxes that topical medications come in in order to help keep track of the instructions about where and how to use these. Pharmacies typically print the medication instructions only on the boxes and not directly on the medication tubes.   If your medication is too expensive, please contact our office at [phone] option 4 or send [place] a message through [place].   We are unable to tell what your co-pay for medications will be in advance as this is different depending on your insurance coverage. However, we may be able to find a substitute medication at lower cost or fill out paperwork to get insurance to cover a needed medication.   If a prior authorization is required to  get your medication covered by your insurance company, please allow [place] 1-2 business days to complete this process.  Drug prices often vary depending on where the prescription is filled and some pharmacies may offer cheaper prices.  The website www.goodrx.com contains coupons for medications through different pharmacies. The prices here do not account for what the cost may be with help from insurance (it may be cheaper with your insurance), but the website can give you the price if you did not use  any insurance.  - You can print the associated coupon and take it with your prescription to the pharmacy.  - You may also stop by our office during regular business hours and pick up a GoodRx coupon card.  - If you need your prescription sent electronically to a different pharmacy, notify our office through [facility] or by phone at [phone] option 4.     Si Usted Necesita Algo Despus de Su Visita  Tambin puede enviarnos un mensaje a travs de Pharmacist, community. Por lo general respondemos a los mensajes de MyChart en el transcurso de 1 a 2 das hbiles.  Para renovar recetas, por favor pida a su farmacia que se ponga en contacto con nuestra oficina. Harland Dingwall de fax es [place] [phone].  Si tiene un asunto urgente cuando la clnica est cerrada y que no puede esperar hasta el siguiente da hbil, puede llamar/localizar a su doctor(a) al nmero que aparece a continuacin.   Por favor, tenga en cuenta que aunque hacemos todo lo posible para estar disponibles para asuntos urgentes fuera del horario de [place], no estamos disponibles las 24 horas del da, los 7 das de la [place].   Si tiene un problema urgente y no puede comunicarse con nosotros, puede optar por buscar atencin mdica  en el consultorio de su doctor(a), en una clnica privada, en un centro de atencin urgente o en una sala de emergencias.  Si tiene Engineering geologist, por favor llame inmediatamente al 911 o vaya a la sala de emergencias.  Nmeros de bper  - Dr. Nehemiah Massed: [phone]  - Dra. Moye: [phone]  - Dra. Nicole Kindred: [phone]  En caso de inclemencias del [place], por favor llame a Johnsie Kindred principal al [phone] para una actualizacin sobre el [place] de cualquier retraso o cierre.  Consejos para la medicacin en dermatologa: Por favor, guarde las cajas en las que vienen los medicamentos de uso tpico para ayudarle a seguir las instrucciones sobre dnde y cmo usarlos. Las farmacias  generalmente imprimen las instrucciones del medicamento slo en las cajas y no directamente en los tubos del [place].   Si su medicamento es muy caro, por favor, pngase en contacto con Zigmund Daniel llamando al [phone] y presione la opcin 4 o envenos un mensaje a travs de Pharmacist, community.   No podemos decirle cul ser su copago por los medicamentos por adelantado ya que esto es diferente dependiendo de la cobertura de su seguro. Sin embargo, es posible que podamos encontrar un medicamento sustituto a Electrical engineer un formulario para que el seguro cubra el medicamento que se considera necesario.   Si se requiere una autorizacin previa para que su compaa de seguros [place] su medicamento, por favor permtanos de 1 a 2 das hbiles para completar este proceso.  Los precios de los medicamentos varan con frecuencia dependiendo del Environmental consultant de dnde se surte la receta y alguna farmacias pueden ofrecer precios ms baratos.  El sitio web www.goodrx.com tiene cupones para medicamentos de Airline pilot.  Los precios aqu no tienen en cuenta lo que podra costar con la ayuda del seguro (puede ser ms barato con su seguro), pero el sitio web puede darle el precio si no utiliz Research scientist (physical sciences).  - Puede imprimir el cupn correspondiente y llevarlo con su receta a la farmacia.  - Tambin puede pasar por nuestra oficina durante el horario de atencin regular y Charity fundraiser una tarjeta de cupones de GoodRx.  - Si necesita que su receta se enve electrnicamente a una farmacia diferente, informe a nuestra oficina a travs de MyChart de [place] o por telfono llamando al [phone] y presione la opcin 4.

## 2022-05-23 ENCOUNTER — Encounter: Payer: Self-pay | Admitting: Dermatology

## 2022-09-13 ENCOUNTER — Encounter: Payer: Self-pay | Admitting: Dermatology

## 2022-09-13 ENCOUNTER — Ambulatory Visit: Payer: Medicare PPO | Admitting: Dermatology

## 2022-09-13 VITALS — BP 128/73 | HR 65

## 2022-09-13 DIAGNOSIS — Z1283 Encounter for screening for malignant neoplasm of skin: Secondary | ICD-10-CM | POA: Diagnosis not present

## 2022-09-13 DIAGNOSIS — L570 Actinic keratosis: Secondary | ICD-10-CM

## 2022-09-13 DIAGNOSIS — D489 Neoplasm of uncertain behavior, unspecified: Secondary | ICD-10-CM

## 2022-09-13 DIAGNOSIS — Z85828 Personal history of other malignant neoplasm of skin: Secondary | ICD-10-CM

## 2022-09-13 DIAGNOSIS — Z872 Personal history of diseases of the skin and subcutaneous tissue: Secondary | ICD-10-CM

## 2022-09-13 DIAGNOSIS — L820 Inflamed seborrheic keratosis: Secondary | ICD-10-CM

## 2022-09-13 DIAGNOSIS — Z8589 Personal history of malignant neoplasm of other organs and systems: Secondary | ICD-10-CM

## 2022-09-13 DIAGNOSIS — W908XXA Exposure to other nonionizing radiation, initial encounter: Secondary | ICD-10-CM

## 2022-09-13 DIAGNOSIS — Z85820 Personal history of malignant melanoma of skin: Secondary | ICD-10-CM

## 2022-09-13 DIAGNOSIS — D1801 Hemangioma of skin and subcutaneous tissue: Secondary | ICD-10-CM

## 2022-09-13 DIAGNOSIS — C44222 Squamous cell carcinoma of skin of right ear and external auricular canal: Secondary | ICD-10-CM

## 2022-09-13 DIAGNOSIS — Z7189 Other specified counseling: Secondary | ICD-10-CM

## 2022-09-13 DIAGNOSIS — L821 Other seborrheic keratosis: Secondary | ICD-10-CM

## 2022-09-13 DIAGNOSIS — I872 Venous insufficiency (chronic) (peripheral): Secondary | ICD-10-CM

## 2022-09-13 DIAGNOSIS — L578 Other skin changes due to chronic exposure to nonionizing radiation: Secondary | ICD-10-CM

## 2022-09-13 DIAGNOSIS — L814 Other melanin hyperpigmentation: Secondary | ICD-10-CM | POA: Diagnosis not present

## 2022-09-13 DIAGNOSIS — L817 Pigmented purpuric dermatosis: Secondary | ICD-10-CM

## 2022-09-13 DIAGNOSIS — Z86006 Personal history of melanoma in-situ: Secondary | ICD-10-CM

## 2022-09-13 NOTE — Progress Notes (Signed)
Follow-Up Visit   Subjective  Grant Miranda is a 89 y.o. male who presents for the following: Skin Cancer Screening and Full Body Skin Exam hx of bcc, hx of scc, hx of mmis, hx of melanoma, hx of aks, hx of isks.  Reports some spots behind right ear he would like checked. Reports mmis at right oral commissure he used topical imiquimod cream to area for 1 weeks and got very blistered and inflamed.  The patient presents for Total-Body Skin Exam (TBSE) for skin cancer screening and mole check. The patient has spots, moles and lesions to be evaluated, some may be new or changing and the patient has concerns that these could be cancer.  The following portions of the chart were reviewed this encounter and updated as appropriate: medications, allergies, medical history  Review of Systems:  No other skin or systemic complaints except as noted in HPI or Assessment and Plan.  Objective  Well appearing patient in no apparent distress; mood and affect are within normal limits.  A full examination was performed including scalp, head, eyes, ears, nose, lips, neck, chest, axillae, abdomen, back, buttocks, bilateral upper extremities, bilateral lower extremities, hands, feet, fingers, toes, fingernails, and toenails. All findings within normal limits unless otherwise noted below.   Relevant physical exam findings are noted in the Assessment and Plan.  left forearm x 1 Erythematous stuck-on, waxy papule or plaque  right ear x 2, left temple x 3, left ear x 1, left nose x 1, right sideburn area x 1, right temple x 1, left thigh x 3 (20) Erythematous thin papules/macules with gritty scale.   right posterior ear 2.2 cm hyperkeratotic papule         Assessment & Plan   LENTIGINES, SEBORRHEIC KERATOSES, HEMANGIOMAS - Benign normal skin lesions - Benign-appearing - Call for any changes  MELANOCYTIC NEVI - Tan-brown and/or pink-flesh-colored symmetric macules and papules - Benign appearing on  exam today - Observation - Call clinic for new or changing moles - Recommend daily use of broad spectrum spf 30+ sunscreen to sun-exposed areas.   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  Recommend daily graduated compression hose/stockings- easiest to put on first thing in morning, remove at bedtime.  Elevate legs as much as possible. Avoid salt/sodium rich foods.   ACTINIC DAMAGE - Chronic condition, secondary to cumulative UV/sun exposure - diffuse scaly erythematous macules with underlying dyspigmentation - Recommend daily broad spectrum sunscreen SPF 30+ to sun-exposed areas, reapply every 2 hours as needed.  - Staying in the shade or wearing long sleeves, sun glasses (UVA+UVB protection) and wide brim hats (4-inch brim around the entire circumference of the hat) are also recommended for sun protection.  - Call for new or changing lesions.  HISTORY OF SQUAMOUS CELL CARCINOMA OF THE SKIN Multiple locations see history  - No evidence of recurrence today - No lymphadenopathy - Recommend regular full body skin exams - Recommend daily broad spectrum sunscreen SPF 30+ to sun-exposed areas, reapply every 2 hours as needed.  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 Clear today at exam .   Will plan repeat treatment with topical Chemo (  Imiquimod) at next visit and biopsy in future. - No evidence of recurrence today - Recommend regular full body skin exams - Recommend daily broad spectrum sunscreen SPF 30+ to sun-exposed areas, reapply every 2 hours as needed.  - Call if any new or changing lesions are noted between office  visits  HISTORY OF BASAL CELL CARCINOMA OF THE SKIN Multiple locations see history  - No evidence of recurrence today - Recommend regular full body skin exams - Recommend daily broad spectrum sunscreen SPF 30+ to sun-exposed areas, reapply every 2 hours as needed.  - Call if any new or changing lesions are noted between office visits  HISTORY OF MELANOMA Right lower medial knee, 2010.  - No evidence of recurrence today - No lymphadenopathy - Recommend regular full body skin exams - Recommend daily broad spectrum sunscreen SPF 30+ to sun-exposed areas, reapply every 2 hours as needed.  - Call if any new or changing lesions are noted between office visits  SKIN CANCER SCREENING PERFORMED TODAY.  Inflamed seborrheic keratosis left forearm x 1  Symptomatic, irritating, patient would like treated.  Destruction of lesion - left forearm x 1 Complexity: simple   Destruction method: cryotherapy   Informed consent: discussed and consent obtained   Timeout:  patient name, date of birth, surgical site, and procedure verified Lesion destroyed using liquid nitrogen: Yes   Region frozen until ice ball extended beyond lesion: Yes   Outcome: patient tolerated procedure well with no complications   Post-procedure details: wound care instructions given    Actinic keratosis (20) right ear x 2, left temple x 3, left ear x 1, left nose x 1, right sideburn area x 1, right temple x 1, left thigh x 3; back x 8  Actinic keratoses are precancerous spots that appear secondary to cumulative UV radiation exposure/sun exposure over time. They are chronic with expected duration over 1 year. A portion of actinic keratoses will progress to squamous cell carcinoma of the skin. It is not possible to reliably predict which spots will progress to skin cancer and so treatment is recommended to prevent development of skin cancer.  Recommend daily broad spectrum sunscreen SPF 30+ to sun-exposed areas, reapply every 2  hours as needed.  Recommend staying in the shade or wearing long sleeves, sun glasses (UVA+UVB protection) and wide brim hats (4-inch brim around the entire circumference of the hat). Call for new or changing lesions.  Destruction of lesion - right ear x 2, left temple x 3, left ear x 1, left nose x 1, right sideburn area x 1, right temple x 1, left thigh x 3; back x 8 Complexity: simple   Destruction method: cryotherapy   Informed consent: discussed and consent obtained   Timeout:  patient name, date of birth, surgical site, and procedure verified Lesion destroyed using liquid nitrogen: Yes   Region frozen until ice ball extended beyond lesion: Yes   Outcome: patient tolerated procedure well with no complications   Post-procedure details: wound care instructions given    Neoplasm of uncertain behavior right posterior ear  Epidermal / dermal shaving  Lesion diameter (cm):  2.2 Informed consent: discussed and consent obtained   Timeout: patient name, date of birth, surgical site, and procedure verified   Procedure prep:  Patient was prepped and draped in usual sterile fashion Prep type:  Isopropyl alcohol Anesthesia: the lesion was anesthetized in a standard fashion   Anesthetic:  1% lidocaine w/ epinephrine 1-100,000 buffered w/ 8.4% NaHCO3 Instrument used:  flexible razor blade   Hemostasis achieved with: pressure, aluminum chloride and electrodesiccation   Outcome: patient tolerated procedure well   Post-procedure details: sterile dressing applied and wound care instructions given   Dressing type: bandage and petrolatum    Destruction of lesion Complexity: extensive   Destruction method: electrodesiccation and curettage   Informed consent: discussed and consent obtained   Timeout:  patient name, date of birth, surgical site, and procedure verified Procedure prep:  Patient was prepped and draped in usual sterile fashion Prep type:  Isopropyl alcohol Anesthesia: the lesion was  anesthetized in a standard fashion   Anesthetic:  1% lidocaine w/ epinephrine 1-100,000 buffered w/ 8.4% NaHCO3 Curettage performed in three different directions: Yes   Electrodesiccation performed over the curetted area: Yes   Lesion length (cm):  2.2 Lesion width (cm):  2.2 Margin per side (cm):  0.2 Final wound size (cm):  2.6 Hemostasis achieved with:  pressure, aluminum chloride and electrodesiccation Outcome: patient tolerated procedure well with no complications   Post-procedure details: sterile dressing applied and wound care instructions given   Dressing type: bandage and petrolatum    Specimen 1 - Surgical pathology Differential Diagnosis: R/o SCC Check Margins: No R/o SCC  FU 4 mos.  IAsher Muir, CMA, am acting as scribe for Armida Sans, MD.  Documentation: I have reviewed the above documentation for accuracy and completeness, and I agree with the above.  Armida Sans, MD

## 2022-09-13 NOTE — Patient Instructions (Addendum)
     Biopsy Wound Care Instructions  Leave the original bandage on for 24 hours if possible.  If the bandage becomes soaked or soiled before that time, it is OK to remove it and examine the wound.  A small amount of post-operative bleeding is normal.  If excessive bleeding occurs, remove the bandage, place gauze over the site and apply continuous pressure (no peeking) over the area for 30 minutes. If this does not work, please call our clinic as soon as possible or page your doctor if it is after hours.   Once a day, cleanse the wound with soap and water. It is fine to shower. If a thick crust develops you may use a Q-tip dipped into dilute hydrogen peroxide (mix 1:1 with water) to dissolve it.  Hydrogen peroxide can slow the healing process, so use it only as needed.    After washing, apply petroleum jelly (Vaseline) or an antibiotic ointment if your doctor prescribed one for you, followed by a bandage.    For best healing, the wound should be covered with a layer of ointment at all times. If you are not able to keep the area covered with a bandage to hold the ointment in place, this may mean re-applying the ointment several times a day.  Continue this wound care until the wound has healed and is no longer open.   Itching and mild discomfort is normal during the healing process. However, if you develop pain or severe itching, please call our office.   If you have any discomfort, you can take Tylenol (acetaminophen) or ibuprofen as directed on the bottle. (Please do not take these if you have an allergy to them or cannot take them for another reason).  Some redness, tenderness and white or yellow material in the wound is normal healing.  If the area becomes very sore and red, or develops a thick yellow-green material (pus), it may be infected; please notify us.    If you have stitches, return to clinic as directed to have the stitches removed. You will continue wound care for 2-3 days after  the stitches are removed.   Wound healing continues for up to one year following surgery. It is not unusual to experience pain in the scar from time to time during the interval.  If the pain becomes severe or the scar thickens, you should notify the office.    A slight amount of redness in a scar is expected for the first six months.  After six months, the redness will fade and the scar will soften and fade.  The color difference becomes less noticeable with time.  If there are any problems, return for a post-op surgery check at your earliest convenience.  To improve the appearance of the scar, you can use silicone scar gel, cream, or sheets (such as Mederma or Serica) every night for up to one year. These are available over the counter (without a prescription).  Please call our office at [phone] for any questions or concerns.    Electrodesiccation and Curettage ("Scrape and Burn") Wound Care Instructions  Leave the original bandage on for 24 hours if possible.  If the bandage becomes soaked or soiled before that time, it is OK to remove it and examine the wound.  A small amount of post-operative bleeding is normal.  If excessive bleeding occurs, remove the bandage, place gauze over the site and apply continuous pressure (no peeking) over the area for 30 minutes. If this   does not work, please call our clinic as soon as possible or page your doctor if it is after hours.   Once a day, cleanse the wound with soap and water. It is fine to shower. If a thick crust develops you may use a Q-tip dipped into dilute hydrogen peroxide (mix 1:1 with water) to dissolve it.  Hydrogen peroxide can slow the healing process, so use it only as needed.    After washing, apply petroleum jelly (Vaseline) or an antibiotic ointment if your doctor prescribed one for you, followed by a bandage.    For best healing, the wound should be covered with a layer of ointment at all times. If you are not able to keep the  area covered with a bandage to hold the ointment in place, this may mean re-applying the ointment several times a day.  Continue this wound care until the wound has healed and is no longer open. It may take several weeks for the wound to heal and close.  Itching and mild discomfort is normal during the healing process.  If you have any discomfort, you can take Tylenol (acetaminophen) or ibuprofen as directed on the bottle. (Please do not take these if you have an allergy to them or cannot take them for another reason).  Some redness, tenderness and white or yellow material in the wound is normal healing.  If the area becomes very sore and red, or develops a thick yellow-green material (pus), it may be infected; please notify us.    Wound healing continues for up to one year following surgery. It is not unusual to experience pain in the scar from time to time during the interval.  If the pain becomes severe or the scar thickens, you should notify the office.    A slight amount of redness in a scar is expected for the first six months.  After six months, the redness will fade and the scar will soften and fade.  The color difference becomes less noticeable with time.  If there are any problems, return for a post-op surgery check at your earliest convenience.  To improve the appearance of the scar, you can use silicone scar gel, cream, or sheets (such as Mederma or Serica) every night for up to one year. These are available over the counter (without a prescription).  Please call our office at [phone] for any questions or concerns.Actinic keratoses are precancerous spots that appear secondary to cumulative UV radiation exposure/sun exposure over time. They are chronic with expected duration over 1 year. A portion of actinic keratoses will progress to squamous cell carcinoma of the skin. It is not possible to reliably predict which spots will progress to skin cancer and so treatment is recommended to  prevent development of skin cancer.  Recommend daily broad spectrum sunscreen SPF 30+ to sun-exposed areas, reapply every 2 hours as needed.  Recommend staying in the shade or wearing long sleeves, sun glasses (UVA+UVB protection) and wide brim hats (4-inch brim around the entire circumference of the hat). Call for new or changing lesions.   Cryotherapy Aftercare  Wash gently with soap and water everyday.   Apply Vaseline and Band-Aid daily until healed.   Seborrheic Keratosis  What causes seborrheic keratoses? Seborrheic keratoses are harmless, common skin growths that first appear during adult life.  As time goes by, more growths appear.  Some people may develop a large number of them.  Seborrheic keratoses appear on both covered and uncovered body parts.  They   are not caused by sunlight.  The tendency to develop seborrheic keratoses can be inherited.  They vary in color from skin-colored to gray, brown, or even black.  They can be either smooth or have a rough, warty surface.   Seborrheic keratoses are superficial and look as if they were stuck on the skin.  Under the microscope this type of keratosis looks like layers upon layers of skin.  That is why at times the top layer may seem to fall off, but the rest of the growth remains and re-grows.    Treatment Seborrheic keratoses do not need to be treated, but can easily be removed in the office.  Seborrheic keratoses often cause symptoms when they rub on clothing or jewelry.  Lesions can be in the way of shaving.  If they become inflamed, they can cause itching, soreness, or burning.  Removal of a seborrheic keratosis can be accomplished by freezing, burning, or surgery. If any spot bleeds, scabs, or grows rapidly, please return to have it checked, as these can be an indication of a skin cancer.       Melanoma ABCDEs  Melanoma is the most dangerous type of skin cancer, and is the leading cause of death from skin disease.  You are more  likely to develop melanoma if you: Have light-colored skin, light-colored eyes, or red or blond hair Spend a lot of time in the sun Tan regularly, either outdoors or in a tanning bed Have had blistering sunburns, especially during childhood Have a close family member who has had a melanoma Have atypical moles or large birthmarks  Early detection of melanoma is key since treatment is typically straightforward and cure rates are extremely high if we catch it early.   The first sign of melanoma is often a change in a mole or a new dark spot.  The ABCDE system is a way of remembering the signs of melanoma.  A for asymmetry:  The two halves do not match. B for border:  The edges of the growth are irregular. C for color:  A mixture of colors are present instead of an even brown color. D for diameter:  Melanomas are usually (but not always) greater than 6mm - the size of a pencil eraser. E for evolution:  The spot keeps changing in size, shape, and color.  Please check your skin once per month between visits. You can use a small mirror in front and a large mirror behind you to keep an eye on the back side or your body.   If you see any new or changing lesions before your next follow-up, please call to schedule a visit.  Please continue daily skin protection including broad spectrum sunscreen SPF 30+ to sun-exposed areas, reapplying every 2 hours as needed when you're outdoors.   Staying in the shade or wearing long sleeves, sun glasses (UVA+UVB protection) and wide brim hats (4-inch brim around the entire circumference of the hat) are also recommended for sun protection.    Due to recent changes in healthcare laws, you may see results of your pathology and/or laboratory studies on MyChart before the doctors have had a chance to review them. We understand that in some cases there may be results that are confusing or concerning to you. Please understand that not all results are received at the same  time and often the doctors may need to interpret multiple results in order to provide you with the best plan of care or course of treatment.   Therefore, we ask that you please give us 2 business days to thoroughly review all your results before contacting the office for clarification. Should we see a critical lab result, you will be contacted sooner.   If You Need Anything After Your Visit  If you have any questions or concerns for your doctor, please call our main line at [phone] and press option 4 to reach your doctor's medical assistant. If no one answers, please leave a voicemail as directed and we will return your call as soon as possible. Messages left after 4 pm will be answered the following business day.   You may also send us a message via MyChart. We typically respond to MyChart messages within 1-2 business days.  For prescription refills, please ask your pharmacy to contact our office. Our fax number is [phone].  If you have an urgent issue when the clinic is closed that cannot wait until the next business day, you can page your doctor at the number below.    Please note that while we do our best to be available for urgent issues outside of office hours, we are not available 24/7.   If you have an urgent issue and are unable to reach us, you may choose to seek medical care at your doctor's office, retail clinic, urgent care center, or emergency room.  If you have a medical emergency, please immediately call 911 or go to the emergency department.  Pager Numbers  - Dr. Kowalski: [phone]  - Dr. Moye: [phone]  - Dr. Stewart: [phone]  In the event of inclement weather, please call our main line at [phone] for an update on the status of any delays or closures.  Dermatology Medication Tips: Please keep the boxes that topical medications come in in order to help keep track of the instructions about where and how to use these. Pharmacies typically print  the medication instructions only on the boxes and not directly on the medication tubes.   If your medication is too expensive, please contact our office at [phone] option 4 or send us a message through MyChart.   We are unable to tell what your co-pay for medications will be in advance as this is different depending on your insurance coverage. However, we may be able to find a substitute medication at lower cost or fill out paperwork to get insurance to cover a needed medication.   If a prior authorization is required to get your medication covered by your insurance company, please allow us 1-2 business days to complete this process.  Drug prices often vary depending on where the prescription is filled and some pharmacies may offer cheaper prices.  The website www.goodrx.com contains coupons for medications through different pharmacies. The prices here do not account for what the cost may be with help from insurance (it may be cheaper with your insurance), but the website can give you the price if you did not use any insurance.  - You can print the associated coupon and take it with your prescription to the pharmacy.  - You may also stop by our office during regular business hours and pick up a GoodRx coupon card.  - If you need your prescription sent electronically to a different pharmacy, notify our office through [facility] MyChart or by phone at [phone] option 4.     Si Usted Necesita Algo Despus de Su Visita  Tambin puede enviarnos un mensaje a travs de MyChart. Por lo general respondemos a los mensajes de MyChart   en el transcurso de 1 a 2 das hbiles.  Para renovar recetas, por favor pida a su farmacia que se ponga en contacto con nuestra oficina. Nuestro nmero de fax es el [phone].  Si tiene un asunto urgente cuando la clnica est cerrada y que no puede esperar hasta el siguiente da hbil, puede llamar/localizar a su doctor(a) al nmero que aparece a  continuacin.   Por favor, tenga en cuenta que aunque hacemos todo lo posible para estar disponibles para asuntos urgentes fuera del horario de oficina, no estamos disponibles las 24 horas del da, los 7 das de la semana.   Si tiene un problema urgente y no puede comunicarse con nosotros, puede optar por buscar atencin mdica  en el consultorio de su doctor(a), en una clnica privada, en un centro de atencin urgente o en una sala de emergencias.  Si tiene una emergencia mdica, por favor llame inmediatamente al 911 o vaya a la sala de emergencias.  Nmeros de bper  - Dr. Kowalski: [phone]  - Dra. Moye: [phone]  - Dra. Stewart: [phone]  En caso de inclemencias del tiempo, por favor llame a nuestra lnea principal al [phone] para una actualizacin sobre el estado de cualquier retraso o cierre.  Consejos para la medicacin en dermatologa: Por favor, guarde las cajas en las que vienen los medicamentos de uso tpico para ayudarle a seguir las instrucciones sobre dnde y cmo usarlos. Las farmacias generalmente imprimen las instrucciones del medicamento slo en las cajas y no directamente en los tubos del medicamento.   Si su medicamento es muy caro, por favor, pngase en contacto con nuestra oficina llamando al [phone] y presione la opcin 4 o envenos un mensaje a travs de MyChart.   No podemos decirle cul ser su copago por los medicamentos por adelantado ya que esto es diferente dependiendo de la cobertura de su seguro. Sin embargo, es posible que podamos encontrar un medicamento sustituto a menor costo o llenar un formulario para que el seguro cubra el medicamento que se considera necesario.   Si se requiere una autorizacin previa para que su compaa de seguros cubra su medicamento, por favor permtanos de 1 a 2 das hbiles para completar este proceso.  Los precios de los medicamentos varan con frecuencia dependiendo del lugar de dnde se surte la receta  y alguna farmacias pueden ofrecer precios ms baratos.  El sitio web www.goodrx.com tiene cupones para medicamentos de diferentes farmacias. Los precios aqu no tienen en cuenta lo que podra costar con la ayuda del seguro (puede ser ms barato con su seguro), pero el sitio web puede darle el precio si no utiliz ningn seguro.  - Puede imprimir el cupn correspondiente y llevarlo con su receta a la farmacia.  - Tambin puede pasar por nuestra oficina durante el horario de atencin regular y recoger una tarjeta de cupones de GoodRx.  - Si necesita que su receta se enve electrnicamente a una farmacia diferente, informe a nuestra oficina a travs de MyChart de [facility] o por telfono llamando al [phone] y presione la opcin 4.  

## 2022-09-15 ENCOUNTER — Encounter: Payer: Self-pay | Admitting: Dermatology

## 2022-09-26 ENCOUNTER — Telehealth: Payer: Self-pay

## 2022-09-26 NOTE — Telephone Encounter (Signed)
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

## 2022-09-26 NOTE — Telephone Encounter (Signed)
Left message on voicemail to return my call.  

## 2022-10-04 ENCOUNTER — Telehealth: Payer: Self-pay

## 2022-10-04 NOTE — Telephone Encounter (Signed)
 Patient informed of pathology results 

## 2022-10-04 NOTE — Telephone Encounter (Signed)
-----   Message from Armida Sans sent at 09/26/2022  1:42 PM EDT ----- Diagnosis Skin , right posterior ear WELL DIFFERENTIATED SQUAMOUS CELL CARCINOMA  Cancer = SCC Already treated Recheck next visit

## 2023-01-24 ENCOUNTER — Ambulatory Visit: Payer: Medicare PPO | Admitting: Dermatology

## 2023-01-24 DIAGNOSIS — L814 Other melanin hyperpigmentation: Secondary | ICD-10-CM | POA: Diagnosis not present

## 2023-01-24 DIAGNOSIS — L578 Other skin changes due to chronic exposure to nonionizing radiation: Secondary | ICD-10-CM | POA: Diagnosis not present

## 2023-01-24 DIAGNOSIS — L821 Other seborrheic keratosis: Secondary | ICD-10-CM

## 2023-01-24 DIAGNOSIS — Z1283 Encounter for screening for malignant neoplasm of skin: Secondary | ICD-10-CM | POA: Diagnosis not present

## 2023-01-24 DIAGNOSIS — L820 Inflamed seborrheic keratosis: Secondary | ICD-10-CM

## 2023-01-24 DIAGNOSIS — W908XXA Exposure to other nonionizing radiation, initial encounter: Secondary | ICD-10-CM

## 2023-01-24 DIAGNOSIS — Z85828 Personal history of other malignant neoplasm of skin: Secondary | ICD-10-CM

## 2023-01-24 DIAGNOSIS — D1801 Hemangioma of skin and subcutaneous tissue: Secondary | ICD-10-CM

## 2023-01-24 DIAGNOSIS — Z8589 Personal history of malignant neoplasm of other organs and systems: Secondary | ICD-10-CM

## 2023-01-24 DIAGNOSIS — L570 Actinic keratosis: Secondary | ICD-10-CM | POA: Diagnosis not present

## 2023-01-24 DIAGNOSIS — Z85820 Personal history of malignant melanoma of skin: Secondary | ICD-10-CM

## 2023-01-24 DIAGNOSIS — D229 Melanocytic nevi, unspecified: Secondary | ICD-10-CM

## 2023-01-24 DIAGNOSIS — Z86006 Personal history of melanoma in-situ: Secondary | ICD-10-CM

## 2023-01-24 NOTE — Patient Instructions (Signed)

## 2023-01-24 NOTE — Progress Notes (Signed)
Follow-Up Visit   Subjective  Grant Miranda is a 89 y.o. male who presents for the following: Skin Cancer Screening and Full Body Skin Exam  The patient presents for Total-Body Skin Exam (TBSE) for skin cancer screening and mole check. The patient has spots, moles and lesions to be evaluated, some may be new or changing and the patient may have concern these could be cancer.  The following portions of the chart were reviewed this encounter and updated as appropriate: medications, allergies, medical history  Review of Systems:  No other skin or systemic complaints except as noted in HPI or Assessment and Plan.  Objective  Well appearing patient in no apparent distress; mood and affect are within normal limits.  A full examination was performed including scalp, head, eyes, ears, nose, lips, neck, chest, axillae, abdomen, back, buttocks, bilateral upper extremities, bilateral lower extremities, hands, feet, fingers, toes, fingernails, and toenails. All findings within normal limits unless otherwise noted below.   Relevant physical exam findings are noted in the Assessment and Plan.  Scalp x 1, L arm x 1 Erythematous stuck-on, waxy papule or plaque    Assessment & Plan   SKIN CANCER SCREENING PERFORMED TODAY.  ACTINIC DAMAGE - Chronic condition, secondary to cumulative UV/sun exposure - diffuse scaly erythematous macules with underlying dyspigmentation - Recommend daily broad spectrum sunscreen SPF 30+ to sun-exposed areas, reapply every 2 hours as needed.  - Staying in the shade or wearing long sleeves, sun glasses (UVA+UVB protection) and wide brim hats (4-inch brim around the entire circumference of the hat) are also recommended for sun protection.  - Call for new or changing lesions.  LENTIGINES, SEBORRHEIC KERATOSES, HEMANGIOMAS - Benign normal skin lesions - Benign-appearing - Call for any changes  MELANOCYTIC NEVI - Tan-brown and/or pink-flesh-colored symmetric macules  and papules - Benign appearing on exam today - Observation - Call clinic for new or changing moles - Recommend daily use of broad spectrum spf 30+ sunscreen to sun-exposed areas.   AK (actinic keratosis) (20) Face, scalp, ears x 20  Hypertrophic -   Actinic keratoses are precancerous spots that appear secondary to cumulative UV radiation exposure/sun exposure over time. They are chronic with expected duration over 1 year. A portion of actinic keratoses will progress to squamous cell carcinoma of the skin. It is not possible to reliably predict which spots will progress to skin cancer and so treatment is recommended to prevent development of skin cancer.  Recommend daily broad spectrum sunscreen SPF 30+ to sun-exposed areas, reapply every 2 hours as needed.  Recommend staying in the shade or wearing long sleeves, sun glasses (UVA+UVB protection) and wide brim hats (4-inch brim around the entire circumference of the hat). Call for new or changing lesions.   Destruction of lesion - Face, scalp, ears x 20 (20) Complexity: simple   Destruction method: cryotherapy   Informed consent: discussed and consent obtained   Timeout:  patient name, date of birth, surgical site, and procedure verified Lesion destroyed using liquid nitrogen: Yes   Region frozen until ice ball extended beyond lesion: Yes   Outcome: patient tolerated procedure well with no complications   Post-procedure details: wound care instructions given    Inflamed seborrheic keratosis Scalp x 1, L arm x 1  Symptomatic, irritating, patient would like treated.   Destruction of lesion - Scalp x 1, L arm x 1 Complexity: simple   Destruction method: cryotherapy   Informed consent: discussed and consent obtained   Timeout:  patient name, date of birth, surgical site, and procedure verified Lesion destroyed using liquid nitrogen: Yes   Region frozen until ice ball extended beyond lesion: Yes   Outcome: patient tolerated  procedure well with no complications   Post-procedure details: wound care instructions given    Actinic skin damage  Skin cancer screening  Lentigo  Melanocytic nevus, unspecified location  History of malignant melanoma  History of basal cell carcinoma  History of squamous cell carcinoma  HISTORY OF BASAL CELL CARCINOMA OF THE SKIN - No evidence of recurrence today - Recommend regular full body skin exams - Recommend daily broad spectrum sunscreen SPF 30+ to sun-exposed areas, reapply every 2 hours as needed.  - Call if any new or changing lesions are noted between office visits  HISTORY OF SQUAMOUS CELL CARCINOMA OF THE SKIN - No evidence of recurrence today - No lymphadenopathy - Recommend regular full body skin exams - Recommend daily broad spectrum sunscreen SPF 30+ to sun-exposed areas, reapply every 2 hours as needed.  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 Clear today at exam .   Discussed repeat treatment with topical Chemo (Imiquimod) and biopsy. Pt declines further treatment unless evidence of recurrence. - No evidence of recurrence today - Recommend regular full body skin exams - Recommend daily broad spectrum sunscreen SPF 30+ to sun-exposed areas, reapply every 2 hours as needed.  - Call if any new or changing lesions are noted between office visits   Return in about 4 months (around 05/24/2023) for TBSE.  Maylene Roes, CMA, am acting as scribe for Armida Sans, MD .   Documentation: I have reviewed the above documentation for accuracy and completeness, and I agree with the above.  Armida Sans, MD

## 2023-01-30 ENCOUNTER — Encounter: Payer: Self-pay | Admitting: Dermatology

## 2023-05-24 ENCOUNTER — Ambulatory Visit: Payer: Medicare PPO | Admitting: Dermatology

## 2023-06-05 ENCOUNTER — Ambulatory Visit: Payer: Medicare PPO | Admitting: Dermatology

## 2023-06-05 ENCOUNTER — Encounter: Payer: Self-pay | Admitting: Dermatology

## 2023-06-05 DIAGNOSIS — D1801 Hemangioma of skin and subcutaneous tissue: Secondary | ICD-10-CM

## 2023-06-05 DIAGNOSIS — Z872 Personal history of diseases of the skin and subcutaneous tissue: Secondary | ICD-10-CM

## 2023-06-05 DIAGNOSIS — L570 Actinic keratosis: Secondary | ICD-10-CM | POA: Diagnosis not present

## 2023-06-05 DIAGNOSIS — L814 Other melanin hyperpigmentation: Secondary | ICD-10-CM | POA: Diagnosis not present

## 2023-06-05 DIAGNOSIS — Z85820 Personal history of malignant melanoma of skin: Secondary | ICD-10-CM

## 2023-06-05 DIAGNOSIS — Z7189 Other specified counseling: Secondary | ICD-10-CM

## 2023-06-05 DIAGNOSIS — L820 Inflamed seborrheic keratosis: Secondary | ICD-10-CM

## 2023-06-05 DIAGNOSIS — Z8589 Personal history of malignant neoplasm of other organs and systems: Secondary | ICD-10-CM

## 2023-06-05 DIAGNOSIS — L578 Other skin changes due to chronic exposure to nonionizing radiation: Secondary | ICD-10-CM

## 2023-06-05 DIAGNOSIS — W908XXA Exposure to other nonionizing radiation, initial encounter: Secondary | ICD-10-CM | POA: Diagnosis not present

## 2023-06-05 DIAGNOSIS — Z85828 Personal history of other malignant neoplasm of skin: Secondary | ICD-10-CM

## 2023-06-05 DIAGNOSIS — Z1283 Encounter for screening for malignant neoplasm of skin: Secondary | ICD-10-CM

## 2023-06-05 DIAGNOSIS — L821 Other seborrheic keratosis: Secondary | ICD-10-CM

## 2023-06-05 DIAGNOSIS — Z86006 Personal history of melanoma in-situ: Secondary | ICD-10-CM

## 2023-06-05 NOTE — Patient Instructions (Addendum)

## 2023-06-05 NOTE — Progress Notes (Signed)
 Follow-Up Visit   Subjective  Grant Miranda is a 89 y.o. male who presents for the following: Skin Cancer Screening and Full Body Skin Exam Hx of aks, hx of isks, hx of melanoma in situ, hx of melanoma, hx of bcc, hx of scc Spot at left temple   The patient presents for Total-Body Skin Exam (TBSE) for skin cancer screening and mole check. The patient has spots, moles and lesions to be evaluated, some may be new or changing and the patient may have concern these could be cancer.  The following portions of the chart were reviewed this encounter and updated as appropriate: medications, allergies, medical history  Review of Systems:  No other skin or systemic complaints except as noted in HPI or Assessment and Plan.  Objective  Well appearing patient in no apparent distress; mood and affect are within normal limits.  A full examination was performed including scalp, head, eyes, ears, nose, lips, neck, chest, axillae, abdomen, back, buttocks, bilateral upper extremities, bilateral lower extremities, hands, feet, fingers, toes, fingernails, and toenails. All findings within normal limits unless otherwise noted below.   Relevant physical exam findings are noted in the Assessment and Plan.      back, arms, and shoulders x 16 (16) Erythematous stuck-on, waxy papule or plaque right chest x 3, face, b/l arms and hands x 21 (24) Erythematous thin papules/macules with gritty scale.   Assessment & Plan   SKIN CANCER SCREENING PERFORMED TODAY.  ACTINIC DAMAGE - Chronic condition, secondary to cumulative UV/sun exposure - diffuse scaly erythematous macules with underlying dyspigmentation - Recommend daily broad spectrum sunscreen SPF 30+ to sun-exposed areas, reapply every 2 hours as needed.  - Staying in the shade or wearing long sleeves, sun glasses (UVA+UVB protection) and wide brim hats (4-inch brim around the entire circumference of the hat) are also recommended for sun protection.  -  Call for new or changing lesions.  LENTIGINES, SEBORRHEIC KERATOSES, HEMANGIOMAS - Benign normal skin lesions - Benign-appearing - Call for any changes  MELANOCYTIC NEVI - Tan-brown and/or pink-flesh-colored symmetric macules and papules - Benign appearing on exam today - Observation - Call clinic for new or changing moles - Recommend daily use of broad spectrum spf 30+ sunscreen to sun-exposed areas.   HISTORY OF BASAL CELL CARCINOMA OF THE SKIN Left temple  Left ear on conchal side of the mid anti helix 01/16/2018 - No evidence of recurrence today - Recommend regular full body skin exams - Recommend daily broad spectrum sunscreen SPF 30+ to sun-exposed areas, reapply every 2 hours as needed.  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 Clear today at exam .   No Lymphadenopathy palpated. Pt declines further treatment unless evidence of recurrence. See photo - No evidence of recurrence today -  Recommend regular full body skin exams - Recommend daily broad spectrum sunscreen SPF 30+ to sun-exposed areas, reapply every 2 hours as needed.  - Call if any new or changing lesions are noted between office visits  HISTORY OF MELANOMA Chest and leg - No evidence of recurrence today - No lymphadenopathy - Recommend regular full body skin exams - Recommend daily broad spectrum sunscreen SPF 30+ to sun-exposed areas, reapply every 2 hours as needed.  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  - Call if any new or changing lesions are noted between office visits  INFLAMED SEBORRHEIC KERATOSIS (16) back, arms, and shoulders x 16 (16) Symptomatic, irritating, patient would like treated. Destruction of lesion - back, arms, and shoulders x 16 (16) Complexity: simple   Destruction method: cryotherapy   Informed consent: discussed and consent obtained   Timeout:  patient name, date of birth, surgical site, and procedure verified Lesion destroyed using liquid nitrogen: Yes   Region frozen until ice ball extended beyond lesion: Yes   Outcome: patient tolerated procedure well with no complications   Post-procedure details: wound care instructions given   ACTINIC KERATOSIS (24) right chest x 3, face, b/l arms and hands x 21 (24) Actinic keratoses are precancerous spots that appear secondary to cumulative UV radiation exposure/sun exposure over time. They are chronic with expected duration over 1 year. A portion of actinic keratoses will progress to squamous cell carcinoma of the skin. It is not possible to reliably predict which spots will progress to skin cancer  and so treatment is recommended to prevent development of skin cancer.  Consider topical chemotherapy with 5-FU in the future.  Recommend daily broad spectrum sunscreen SPF 30+ to sun-exposed areas, reapply every 2 hours as needed.  Recommend staying in the shade or wearing long sleeves, sun glasses (UVA+UVB protection) and wide brim hats (4-inch brim around the entire circumference of the hat). Call for new or changing lesions. Destruction of lesion - right chest x 3, face, b/l arms and hands x 21 (24) Complexity: simple   Destruction method: cryotherapy   Informed consent: discussed and consent obtained   Timeout:  patient name, date of birth, surgical site, and procedure verified Lesion destroyed using liquid nitrogen: Yes   Region frozen until ice ball extended beyond lesion: Yes   Outcome: patient tolerated procedure well with no complications   Post-procedure details: wound care instructions given   SKIN CANCER SCREENING   ACTINIC SKIN DAMAGE   HISTORY OF MALIGNANT MELANOMA   HISTORY OF BASAL CELL CARCINOMA   HISTORY OF SQUAMOUS CELL CARCINOMA   COUNSELING AND COORDINATION OF CARE   Return in about 6 months (around 12/06/2023) for TBSE.  IAsher Muir, CMA, am acting as scribe for Armida Sans, MD.   Documentation: I have reviewed the above documentation for accuracy and completeness, and I agree with the above.  Armida Sans, MD

## 2023-12-11 ENCOUNTER — Encounter: Payer: Self-pay | Admitting: Dermatology

## 2023-12-11 ENCOUNTER — Ambulatory Visit: Admitting: Dermatology

## 2023-12-11 DIAGNOSIS — Z1283 Encounter for screening for malignant neoplasm of skin: Secondary | ICD-10-CM | POA: Diagnosis not present

## 2023-12-11 DIAGNOSIS — Z7189 Other specified counseling: Secondary | ICD-10-CM

## 2023-12-11 DIAGNOSIS — L578 Other skin changes due to chronic exposure to nonionizing radiation: Secondary | ICD-10-CM | POA: Diagnosis not present

## 2023-12-11 DIAGNOSIS — D1801 Hemangioma of skin and subcutaneous tissue: Secondary | ICD-10-CM | POA: Diagnosis not present

## 2023-12-11 DIAGNOSIS — Z85828 Personal history of other malignant neoplasm of skin: Secondary | ICD-10-CM

## 2023-12-11 DIAGNOSIS — W908XXA Exposure to other nonionizing radiation, initial encounter: Secondary | ICD-10-CM

## 2023-12-11 DIAGNOSIS — Z79899 Other long term (current) drug therapy: Secondary | ICD-10-CM

## 2023-12-11 DIAGNOSIS — L820 Inflamed seborrheic keratosis: Secondary | ICD-10-CM

## 2023-12-11 DIAGNOSIS — L570 Actinic keratosis: Secondary | ICD-10-CM

## 2023-12-11 DIAGNOSIS — L821 Other seborrheic keratosis: Secondary | ICD-10-CM

## 2023-12-11 DIAGNOSIS — Z86018 Personal history of other benign neoplasm: Secondary | ICD-10-CM

## 2023-12-11 DIAGNOSIS — L814 Other melanin hyperpigmentation: Secondary | ICD-10-CM

## 2023-12-11 DIAGNOSIS — Z85820 Personal history of malignant melanoma of skin: Secondary | ICD-10-CM

## 2023-12-11 DIAGNOSIS — Z8589 Personal history of malignant neoplasm of other organs and systems: Secondary | ICD-10-CM

## 2023-12-11 DIAGNOSIS — D229 Melanocytic nevi, unspecified: Secondary | ICD-10-CM

## 2023-12-11 DIAGNOSIS — S81811A Laceration without foreign body, right lower leg, initial encounter: Secondary | ICD-10-CM | POA: Diagnosis not present

## 2023-12-11 MED ORDER — MUPIROCIN 2 % EX OINT: TOPICAL_OINTMENT | CUTANEOUS | 3 refills | Status: AC

## 2023-12-11 NOTE — Progress Notes (Signed)
 Follow-Up Visit   Subjective  Grant Miranda is a 89 y.o. male who presents for the following: 6 months f/u Skin Cancer Screening and Full Body Skin Exam, hx of skin cancer   The patient presents for Total-Body Skin Exam (TBSE) for skin cancer screening and mole check. The patient has spots, moles and lesions to be evaluated, some may be new or changing and the patient may have concern these could be cancer.  The following portions of the chart were reviewed this encounter and updated as appropriate: medications, allergies, medical history  Review of Systems:  No other skin or systemic complaints except as noted in HPI or Assessment and Plan.  Objective  Well appearing patient in no apparent distress; mood and affect are within normal limits.  A full examination was performed including scalp, head, eyes, ears, nose, lips, neck, chest, axillae, abdomen, back, buttocks, bilateral upper extremities, bilateral lower extremities, hands, feet, fingers, toes, fingernails, and toenails. All findings within normal limits unless otherwise noted below.   Relevant physical exam findings are noted in the Assessment and Plan.  left side burn x 1, right scalp x 1 (2) Stuck-on, waxy, tan-brown papules and plaques -- Discussed benign etiology and prognosis.  scalp, face, ears, shoulders x 23 (23) Erythematous thin papules/macules with gritty scale.   Assessment & Plan   SKIN CANCER SCREENING PERFORMED TODAY.  ACTINIC DAMAGE - Chronic condition, secondary to cumulative UV/sun exposure - diffuse scaly erythematous macules with underlying dyspigmentation - Recommend daily broad spectrum sunscreen SPF 30+ to sun-exposed areas, reapply every 2 hours as needed.  - Staying in the shade or wearing long sleeves, sun glasses (UVA+UVB protection) and wide brim hats (4-inch brim around the entire circumference of the hat) are also recommended for sun protection.  - Call for new or changing  lesions.  LENTIGINES, SEBORRHEIC KERATOSES, HEMANGIOMAS - Benign normal skin lesions - Benign-appearing - Call for any changes  MELANOCYTIC NEVI - Tan-brown and/or pink-flesh-colored symmetric macules and papules - Benign appearing on exam today - Observation - Call clinic for new or changing moles - Recommend daily use of broad spectrum spf 30+ sunscreen to sun-exposed areas.    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  - Call if any new or changing lesions are noted between office visits   MELANOMA IN SITU Lentigo Maligna type, bx proven 03/23/2021  R oral commisure  S/P treatment with LN2 destruction and topical Chemo = Imiquimod . Clear today at exam .   No Lymphadenopathy palpated. Pt declines further treatment unless evidence of recurrence. See photo - No evidence of recurrence today - Recommend regular full body skin exams - Recommend daily broad spectrum sunscreen SPF 30+ to sun-exposed areas, reapply every 2 hours as needed.  - Call if any new or changing lesions are noted between office  visits   HISTORY OF MELANOMA Chest and leg - No evidence of recurrence today - No lymphadenopathy - Recommend regular full body skin exams - Recommend daily broad spectrum sunscreen  SPF 30+ to sun-exposed areas, reapply every 2 hours as needed.  - Call if any new or changing lesions are noted between office visits   HISTORY OF ATYPICAL MELANOCYTIC HYPERPLASIA 11/25/2012  AMP right paraspinal lower back excised 01/08/2013  No evidence of recurrence today - No lymphadenopathy - Recommend regular full body skin exams - Recommend daily broad spectrum sunscreen SPF 30+ to sun-exposed areas, reapply every 2 hours as needed.  - Call if any new or changing lesions are noted between office visits   INFLAMED SEBORRHEIC KERATOSIS (2) left side burn x 1, right scalp x 1 (2) Symptomatic, irritating, patient would like treated.  Destruction of lesion - left side burn x 1, right scalp x 1 (2) Complexity: simple   Destruction method: cryotherapy   Informed consent: discussed and consent obtained   Timeout:  patient name, date of birth, surgical site, and procedure verified Lesion destroyed using liquid nitrogen: Yes   Region frozen until ice ball extended beyond lesion: Yes   Outcome: patient tolerated procedure well with no complications   Post-procedure details: wound care instructions given    [place] (ACTINIC KERATOSIS) (23) scalp, face, ears, shoulders x 23 (23) ACTINIC DAMAGE - chronic, secondary to cumulative UV radiation exposure/sun exposure over time - diffuse scaly erythematous macules with underlying dyspigmentation - Recommend daily broad spectrum sunscreen SPF 30+ to sun-exposed areas, reapply every 2 hours as needed.  - Recommend staying in the shade or wearing long sleeves, sun glasses (UVA+UVB protection) and wide brim hats (4-inch brim around the entire circumference of the hat). - Call for new or changing lesions.  Destruction of lesion - scalp, face, ears, shoulders x 23 (23) Complexity: simple   Destruction method: cryotherapy   Informed consent: discussed and consent obtained   Timeout:  patient name, date of birth, surgical site, and procedure  verified Lesion destroyed using liquid nitrogen: Yes   Region frozen until ice ball extended beyond lesion: Yes   Outcome: patient tolerated procedure well with no complications   Post-procedure details: wound care instructions given    LACERATION OF RIGHT LOWER EXTREMITY, INITIAL ENCOUNTER  LACERATION Right medial thigh  Area cleansed with Puracyn, then applied Mupirocin  and a band aid  Start Mupirocin  ointment daily    Related Medications mupirocin  ointment (BACTROBAN ) 2 % Apply to wound once daily until healed ACTINIC SKIN DAMAGE   HISTORY OF MALIGNANT MELANOMA   HISTORY OF SQUAMOUS CELL CARCINOMA   SKIN CANCER SCREENING   LENTIGO   MELANOCYTIC NEVUS, UNSPECIFIED LOCATION   COUNSELING AND COORDINATION OF CARE   MEDICATION MANAGEMENT     Return in about 6 months (around 06/09/2024) for TBSE, hx of skin cancers.  IFay Kirks, CMA, am acting as scribe for Alm Rhyme, MD .   Documentation: I have reviewed the above documentation for accuracy and completeness, and I agree with the above.  Alm Rhyme, MD

## 2023-12-11 NOTE — Patient Instructions (Addendum)

## 2024-06-10 ENCOUNTER — Ambulatory Visit: Admitting: Dermatology
